# Patient Record
Sex: Female | Born: 1975 | Race: Black or African American | Hispanic: No | Marital: Single | Smoking: Current every day smoker
Health system: Southern US, Community
[De-identification: ages and names within clinical notes are randomized; demographics above are authoritative.]

## PROBLEM LIST (undated history)

## (undated) HISTORY — PX: TUBAL LIGATION: SHX77

---

## 2003-11-04 ENCOUNTER — Emergency Department: Payer: Self-pay | Admitting: Emergency Medicine

## 2003-11-13 ENCOUNTER — Ambulatory Visit: Payer: Self-pay | Admitting: Orthopaedic Surgery

## 2019-04-26 ENCOUNTER — Emergency Department: Payer: Self-pay

## 2019-04-26 ENCOUNTER — Inpatient Hospital Stay
Admission: EM | Admit: 2019-04-26 | Discharge: 2019-04-29 | DRG: 392 | Disposition: A | Discharge status: Home / Self Care | Payer: Self-pay | Attending: Surgery | Admitting: Surgery

## 2019-04-26 ENCOUNTER — Other Ambulatory Visit: Payer: Self-pay

## 2019-04-26 DIAGNOSIS — K5720 Diverticulitis of large intestine with perforation and abscess without bleeding: Principal | ICD-10-CM | POA: Diagnosis present

## 2019-04-26 DIAGNOSIS — K5900 Constipation, unspecified: Secondary | ICD-10-CM | POA: Diagnosis present

## 2019-04-26 DIAGNOSIS — K5780 Diverticulitis of intestine, part unspecified, with perforation and abscess without bleeding: Secondary | ICD-10-CM | POA: Diagnosis present

## 2019-04-26 DIAGNOSIS — E876 Hypokalemia: Secondary | ICD-10-CM | POA: Diagnosis present

## 2019-04-26 DIAGNOSIS — K651 Peritoneal abscess: Secondary | ICD-10-CM

## 2019-04-26 DIAGNOSIS — Z20822 Contact with and (suspected) exposure to covid-19: Secondary | ICD-10-CM | POA: Diagnosis present

## 2019-04-26 LAB — COMPREHENSIVE METABOLIC PANEL
ALT: 21 U/L (ref 0–44)
AST: 24 U/L (ref 15–41)
Albumin: 3.4 g/dL — ABNORMAL LOW (ref 3.5–5.0)
Alkaline Phosphatase: 66 U/L (ref 38–126)
Anion gap: 14 (ref 5–15)
BUN: <5 mg/dL — ABNORMAL LOW (ref 6–20)
CO2: 20 mmol/L — ABNORMAL LOW (ref 22–32)
Calcium: 8.9 mg/dL (ref 8.9–10.3)
Chloride: 97 mmol/L — ABNORMAL LOW (ref 98–111)
Creatinine, Ser: 0.93 mg/dL (ref 0.44–1.00)
GFR calc Af Amer: >60 mL/min (ref >60)
GFR calc non Af Amer: >60 mL/min (ref >60)
Glucose, Bld: 188 mg/dL — ABNORMAL HIGH (ref 70–99)
Potassium: 3.1 mmol/L — ABNORMAL LOW (ref 3.5–5.1)
Sodium: 131 mmol/L — ABNORMAL LOW (ref 135–145)
Total Bilirubin: 1.8 mg/dL — ABNORMAL HIGH (ref 0.3–1.2)
Total Protein: 8 g/dL (ref 6.5–8.1)

## 2019-04-26 LAB — CBC
HCT: 36.2 % (ref 36.0–46.0)
Hemoglobin: 11.7 g/dL — ABNORMAL LOW (ref 12.0–15.0)
MCH: 27.6 pg (ref 26.0–34.0)
MCHC: 32.3 g/dL (ref 30.0–36.0)
MCV: 85.4 fL (ref 80.0–100.0)
Platelets: 477 10*3/uL — ABNORMAL HIGH (ref 150–400)
RBC: 4.24 MIL/uL (ref 3.87–5.11)
RDW: 15.9 % — ABNORMAL HIGH (ref 11.5–15.5)
WBC: 15.9 10*3/uL — ABNORMAL HIGH (ref 4.0–10.5)
nRBC: 0 % (ref 0.0–0.2)

## 2019-04-26 LAB — RESPIRATORY PANEL BY RT PCR (FLU A&B, COVID)
Influenza A by PCR: NEGATIVE
Influenza B by PCR: NEGATIVE
SARS Coronavirus 2 by RT PCR: NEGATIVE

## 2019-04-26 LAB — LIPASE, BLOOD: Lipase: 18 U/L (ref 11–51)

## 2019-04-26 MED ORDER — HYDROMORPHONE HCL 1 MG/ML IJ SOLN
1.0000 mg | Freq: Once | INTRAMUSCULAR | Status: AC
  Administered 2019-04-26: 1 mg via INTRAVENOUS
  Filled 2019-04-26: qty 1

## 2019-04-26 MED ORDER — KETOROLAC TROMETHAMINE 30 MG/ML IJ SOLN
30.0000 mg | Freq: Once | INTRAMUSCULAR | Status: AC
  Administered 2019-04-26: 30 mg via INTRAVENOUS
  Filled 2019-04-26: qty 1

## 2019-04-26 MED ORDER — POTASSIUM CHLORIDE IN NACL 20-0.9 MEQ/L-% IV SOLN
INTRAVENOUS | Status: DC
  Filled 2019-04-26 (×8): qty 1000

## 2019-04-26 MED ORDER — METRONIDAZOLE IN NACL 5-0.79 MG/ML-% IV SOLN
500.0000 mg | Freq: Once | INTRAVENOUS | Status: AC
  Administered 2019-04-26: 500 mg via INTRAVENOUS
  Filled 2019-04-26: qty 100

## 2019-04-26 MED ORDER — ONDANSETRON HCL 4 MG/2ML IJ SOLN
4.0000 mg | Freq: Once | INTRAMUSCULAR | Status: AC
  Administered 2019-04-26: 4 mg via INTRAVENOUS
  Filled 2019-04-26: qty 2

## 2019-04-26 MED ORDER — KETOROLAC TROMETHAMINE 30 MG/ML IJ SOLN
30.0000 mg | Freq: Four times a day (QID) | INTRAMUSCULAR | Status: DC | PRN
  Administered 2019-04-27 – 2019-04-28 (×2): 30 mg via INTRAVENOUS
  Filled 2019-04-26 (×2): qty 1

## 2019-04-26 MED ORDER — ONDANSETRON 4 MG PO TBDP: 4.0000 mg | ORAL_TABLET | Freq: Four times a day (QID) | ORAL | Status: DC | PRN

## 2019-04-26 MED ORDER — HYDRALAZINE HCL 20 MG/ML IJ SOLN: 10.0000 mg | INTRAMUSCULAR | Status: DC | PRN

## 2019-04-26 MED ORDER — IOHEXOL 300 MG/ML  SOLN
100.0000 mL | Freq: Once | INTRAMUSCULAR | Status: AC | PRN
  Administered 2019-04-26: 100 mL via INTRAVENOUS

## 2019-04-26 MED ORDER — SODIUM CHLORIDE 0.9 % IV BOLUS
1000.0000 mL | Freq: Once | INTRAVENOUS | Status: AC
  Administered 2019-04-26: 1000 mL via INTRAVENOUS

## 2019-04-26 MED ORDER — MORPHINE SULFATE (PF) 4 MG/ML IV SOLN
4.0000 mg | Freq: Once | INTRAVENOUS | Status: AC
  Administered 2019-04-26: 4 mg via INTRAVENOUS
  Filled 2019-04-26: qty 1

## 2019-04-26 MED ORDER — HYDROMORPHONE HCL 1 MG/ML IJ SOLN
0.5000 mg | INTRAMUSCULAR | Status: DC | PRN
  Administered 2019-04-26 – 2019-04-29 (×10): 1 mg via INTRAVENOUS
  Filled 2019-04-26 (×11): qty 1

## 2019-04-26 MED ORDER — ONDANSETRON HCL 4 MG/2ML IJ SOLN
4.0000 mg | Freq: Four times a day (QID) | INTRAMUSCULAR | Status: DC | PRN
  Administered 2019-04-27 – 2019-04-28 (×4): 4 mg via INTRAVENOUS
  Filled 2019-04-26 (×4): qty 2

## 2019-04-26 MED ORDER — OXYCODONE HCL 5 MG PO TABS
5.0000 mg | ORAL_TABLET | ORAL | Status: DC | PRN
  Administered 2019-04-26: 5 mg via ORAL
  Administered 2019-04-27 – 2019-04-29 (×6): 10 mg via ORAL
  Filled 2019-04-26 (×3): qty 2
  Filled 2019-04-26: qty 1
  Filled 2019-04-26 (×3): qty 2

## 2019-04-26 MED ORDER — SODIUM CHLORIDE 0.9 % IV SOLN
INTRAVENOUS | Status: DC | PRN
  Administered 2019-04-26: 250 mL via INTRAVENOUS
  Administered 2019-04-27: 1000 mL via INTRAVENOUS
  Administered 2019-04-27 – 2019-04-28 (×2): 250 mL via INTRAVENOUS

## 2019-04-26 MED ORDER — ACETAMINOPHEN 500 MG PO TABS
1000.0000 mg | ORAL_TABLET | Freq: Four times a day (QID) | ORAL | Status: DC
  Administered 2019-04-26 – 2019-04-29 (×10): 1000 mg via ORAL
  Filled 2019-04-26 (×11): qty 2

## 2019-04-26 MED ORDER — CIPROFLOXACIN IN D5W 400 MG/200ML IV SOLN
400.0000 mg | Freq: Once | INTRAVENOUS | Status: AC
  Administered 2019-04-26: 400 mg via INTRAVENOUS
  Filled 2019-04-26: qty 200

## 2019-04-26 MED ORDER — IOHEXOL 9 MG/ML PO SOLN
500.0000 mL | ORAL | Status: AC
  Administered 2019-04-26 (×2): 500 mL via ORAL

## 2019-04-26 MED ORDER — PIPERACILLIN-TAZOBACTAM 3.375 G IVPB
3.3750 g | Freq: Three times a day (TID) | INTRAVENOUS | Status: DC
  Administered 2019-04-26 – 2019-04-29 (×9): 3.375 g via INTRAVENOUS
  Filled 2019-04-26 (×9): qty 50

## 2019-04-26 NOTE — ED Notes (Signed)
Unable to have a BM or urinate at this time. Back in bed resting.

## 2019-04-26 NOTE — ED Notes (Signed)
Pt ambulatory to bathroom independently w/out difficulty.

## 2019-04-26 NOTE — ED Notes (Signed)
This RN and EDP, Paduchowski to bedside; rectal exam performed via EDP.

## 2019-04-26 NOTE — Consult Note (Signed)
Chief Complaint: Patient was seen in consultation today for intra-abdominal/pelvic abscess  Referring Physician(s): Lynden Oxford, PA-C/Pabon, Diego, MD  Supervising Physician: Richarda Overlie  Patient Status: [facility] - ED  History of Present Illness: Haley Delgado is a 44 y.o. female with a past medical history significant for perforated diverticulitis with abscess and drain placement at [facility] (admitted 04/04/19 - 04/08/19) with removal 04/20/19 who presented to [facility] ED earlier this morning with complaints of worsening abdominal pain x 2 days. Initial workup in the ED notable for tachycardia (100-123), WBC 15.9, hgb 11.7, plt 477, K+ 3.1, t.bili 1.8, COVID (-). CT abd/pelvis w/copntrast was performed which showed a 2.8 x 3.9 x 2.2 cm fluid collection along the dome of the uterus likely representing recurrent abscess, larger collection in the cul-de-sac possibly communicating with the more superior collection, inflammatory changes around the adnexa and distal small bowel and extensive diverticular changes within the mid sigmoid colon. General surgery was consulted and as no emergent surgical intervention was indicated IR has been consulted for percutaneous drain placement.   Haley Delgado states she felt very good initially after her previous drain was removed, however she gradually began to have worsening abdominal pain that started off as a dull/achy feeling and over the last two days has progressed to intense pressure/tenderness in her lower abdomen. She also reports rectal pain and the sensation of needing to have a bowel movement but is unable to do so. She states her PO intake at home has been poor with intermittent nausea and vomiting, her last bowel movement was on Thursday. She denies any melena, hematochezia or hematemesis. She understands the need for another drain and while she is disappointed, she is agreeable to proceed.  History reviewed. No pertinent past medical  history.  History reviewed. No pertinent surgical history.  Allergies: Peanut oil  Medications: Prior to Admission medications   Not on File     No family history on file.  Social History   Socioeconomic History  . Marital status: Single    Spouse name: Not on file  . Number of children: Not on file  . Years of education: Not on file  . Highest education level: Not on file  Occupational History  . Not on file  Tobacco Use  . Smoking status: Not on file  Substance and Sexual Activity  . Alcohol use: Not on file  . Drug use: Not on file  . Sexual activity: Not on file  Other Topics Concern  . Not on file  Social History Narrative  . Not on file   Social Determinants of Health   Financial Resource Strain:   . Difficulty of Paying Living Expenses:   Food Insecurity:   . Worried About Programme researcher, broadcasting/film/video in the Last Year:   . Barista in the Last Year:   Transportation Needs:   . Freight forwarder (Medical):   Marland Kitchen Lack of Transportation (Non-Medical):   Physical Activity:   . Days of Exercise per Week:   . Minutes of Exercise per Session:   Stress:   . Feeling of Stress :   Social Connections:   . Frequency of Communication with Friends and Family:   . Frequency of Social Gatherings with Friends and Family:   . Attends Religious Services:   . Active Member of Clubs or Organizations:   . Attends Banker Meetings:   Marland Kitchen Marital Status:      Review of Systems: A 12  point ROS discussed and pertinent positives are indicated in the HPI above.  All other systems are negative.  Review of Systems  Constitutional: Positive for appetite change and fatigue. Negative for chills and fever.  Respiratory: Negative for cough and shortness of breath.   Cardiovascular: Negative for chest pain.  Gastrointestinal: Positive for abdominal pain, nausea, rectal pain and vomiting. Negative for blood in stool, constipation and diarrhea.  Genitourinary: Negative  for dysuria and hematuria.  Musculoskeletal: Negative for back pain.  Skin: Negative for color change.  Neurological: Negative for dizziness and headaches.    Vital Signs: BP 125/69   Pulse (!) 107   Temp 99.4 F (37.4 C) (Oral)   Resp 16   Ht 5\' 9"  (1.753 m)   Wt 229 lb (103.9 kg)   LMP 04/12/2019 (Exact Date)   SpO2 95%   BMI 33.82 kg/m   Physical Exam Vitals and nursing note reviewed.  Constitutional:      General: She is not in acute distress.    Appearance: She is obese.     Comments: Very pleasant, talkative, good historian.  HENT:     Head: Normocephalic.     Mouth/Throat:     Mouth: Mucous membranes are moist.     Pharynx: Oropharynx is clear. No oropharyngeal exudate or posterior oropharyngeal erythema.  Eyes:     General: No scleral icterus. Cardiovascular:     Rate and Rhythm: Regular rhythm. Tachycardia present.  Pulmonary:     Effort: Pulmonary effort is normal.     Breath sounds: Normal breath sounds.  Abdominal:     General: There is no distension.     Palpations: Abdomen is soft.     Tenderness: There is abdominal tenderness (mostly pelvic and RLQ - some LLQ ).  Skin:    General: Skin is warm and dry.     Coloration: Skin is not jaundiced.  Neurological:     Mental Status: She is alert and oriented to person, place, and time.  Psychiatric:        Mood and Affect: Mood normal.        Behavior: Behavior normal.        Thought Content: Thought content normal.        Judgment: Judgment normal.      MD Evaluation Airway: WNL Heart: WNL Abdomen: WNL Chest/ Lungs: WNL ASA  Classification: 2 Mallampati/Airway Score: Two   Imaging: CT ABDOMEN PELVIS W CONTRAST  Result Date: 04/26/2019 CLINICAL DATA:  Abdominal pain. Recent episode of diverticulitis complicated without abscess. Percutaneous drain removed 1 week ago. EXAM: CT ABDOMEN AND PELVIS WITH CONTRAST TECHNIQUE: Multidetector CT imaging of the abdomen and pelvis was performed using the  standard protocol following bolus administration of intravenous contrast. CONTRAST:  19mL OMNIPAQUE IOHEXOL 300 MG/ML  SOLN COMPARISON:  Report of CT abdomen and pelvis at [facility] 04/18/19 FINDINGS: Lower chest: Mild dependent airspace disease is present in the left lower lobe. Right lung is clear. Heart size is normal. Hepatobiliary: No focal liver abnormality is seen. No gallstones, gallbladder wall thickening, or biliary dilatation. Pancreas: Unremarkable. No pancreatic ductal dilatation or surrounding inflammatory changes. Spleen: Normal in size without focal abnormality. Adrenals/Urinary Tract: 7 mm myelolipoma again seen in the right adrenal gland. Left adrenal is normal. The kidneys and ureters are within normal limits bilaterally. The urinary bladder is normal. Stomach/Bowel: A small hiatal hernia is present. The stomach and duodenum are otherwise within normal limits. The small bowel is unremarkable. Bowel is mostly  displaced by marked inflammatory changes within the anatomic pelvis. The inflammatory changes surround the distal small bowel. Cecum is within normal limits. The ascending and transverse colon are normal. The descending colon is within normal limits. Inflammatory changes are mostly anterior to the mid sigmoid colon with extensive diverticular change. Distal sigmoid is collapsed. Vascular/Lymphatic: Atherosclerotic calcifications are present without aneurysm. Reproductive: Uterus is within normal limits. Inflammatory changes surround the adnexa bilaterally. Other: A fluid collection along the dome of the uterus measures 2.8 x 3.9 x 2.2 cm. This may communicate with a much larger collection posteriorly in the cul-de-sac measuring 8.3 x 5.8 x 7.5 cm. This displaces the uterus and the rectosigmoid colon. Portions of this collection extend superiorly on both sides. Musculoskeletal: Sclerotic changes in chronic disc disease are noted at L4-5. Straightening of the normal lumbar lordosis is  evident. Mild degenerative changes are noted in the SI joints. No other focal lytic or blastic lesions are present. Hips are located and within normal limits bilaterally. IMPRESSION: 1. 2.8 x 3.9 x 2.2 cm fluid collection along the dome of the uterus likely represents a recurrent abscess. This appears to be where the drain was previously located. 2. Much larger collection is present in the cul-de-sac, potentially communicating with the more superior collection. This also represents a large abscess. 3. Inflammatory changes surround the adnexa and distal small bowel. 4. Extensive diverticular change within the mid sigmoid colon. The inflammatory changes are adjacent to this portion of the bowel. 5. Mild dependent airspace disease in the left lower lobe likely represents atelectasis. 6. 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 7. Degenerative changes of the lower lumbar spine. These results were called by telephone at the time of interpretation on 04/26/2019 at 9:54 am to provider [facility] , who verbally acknowledged these results. Electronically Signed   By: Marin Roberts M.D.   On: 04/26/2019 09:55    Labs:  CBC: Recent Labs    04/26/19 0732  WBC 15.9*  HGB 11.7*  HCT 36.2  PLT 477*    COAGS: No results for input(s): INR, APTT in the last 8760 hours.  BMP: Recent Labs    04/26/19 0732  NA 131*  K 3.1*  CL 97*  CO2 20*  GLUCOSE 188*  BUN <5*  CALCIUM 8.9  CREATININE 0.93  GFRNONAA >60  GFRAA >60    LIVER FUNCTION TESTS: Recent Labs    04/26/19 0732  BILITOT 1.8*  AST 24  ALT 21  ALKPHOS 66  PROT 8.0  ALBUMIN 3.4*    TUMOR MARKERS: No results for input(s): AFPTM, CEA, CA199, CHROMGRNA in the last 8760 hours.  Assessment and Plan:  44 y/o F with recent perforated diverticulitis requiring percutaneous drain placement 04/04/19 at [facility] with removal of drain 04/20/19 who presented to [facility] ED today with worsening abdominal pain, rectal pain, n/v and  poor PO intake. Initial workup significant for tachycardia and leukocytosis. Imaging shows recurrent diverticular abscess. General surgery was consulted and given no indication for immediate surgical intervention IR has been asked to place a percutaneous abscess drain.  Will plan for drain placement 04/27/19 with Dr. Lowella Dandy - patient to be NPO after midnight, hold lovenox/heparin until post procedure, pre-procedure labs have been ordered by general surgery (appreciate their assistance). IR will call for patient when ready.   While inpatient drain will need to be flushed Qshift with 5 cc NS, output recorded Qshift, call IR if unable to flush drain or sudden change in output,  dressing changes QD or PRN if soiled. Upon discharge patient will need to flush the drain QD with 5 cc NS (will require rx for flushes from admitting service at d/c), record output QD, dressing changes every 2-3 days and follow up in IR clinic 10-14 days post drain placement. I have placed orders to facilitate clinic follow up and written drain care instructions. Please call with any questions or concerns.  Risks and benefits discussed with the patient including bleeding, infection, damage to adjacent structures, bowel perforation/fistula connection, and sepsis.  All of the patient's questions were answered, patient is agreeable to proceed.  Consent signed and in chart.  Thank you for this interesting consult.  I greatly enjoyed meeting Haley Delgado and look forward to participating in their care.  A copy of this report was sent to the requesting provider on this date.  Electronically Signed: Villa Herb, PA-C 04/26/2019, 2:37 PM   I spent a total of 40 Minutes  in face to face in clinical consultation, greater than 50% of which was counseling/coordinating care for diverticular abscess drain placement.

## 2019-04-26 NOTE — ED Triage Notes (Signed)
Pt very poor historian. States that some drain was removed on Tuesday and since then she has been having pain to abdomen and rectum.

## 2019-04-26 NOTE — ED Notes (Signed)
Pt on toilet, attempting to have BM or urinate. Aware that we need sample and has collection hat in toilet.

## 2019-04-26 NOTE — H&P (Signed)
[facility] SURGICAL HISTORY & PHYSICAL (cpt [phone])  HISTORY OF PRESENT ILLNESS (HPI):  Upon chart review, patient was admitted to [facility] from 03/08 - 03/12 for perforated diverticulitis with abscess which was percutaneously drained. She was managed with IV Zosyn and her pain and leukocytosis resolved. She was discharged on Augmentin with general surgery follow up. She had repeat CT in follow up which showed resolution of abscess. As such, drain was removed on 03/24. She was scheduled to follow up with general surgery there following colonoscopy for laparoscopic sigmoid colectomy.   44 y.o. female presented to [facility] ED today for abdominal pain. She reports that after drain removal she was doing well. However, on Sunday evening she noticed the onset of mild lower abdominal pain. This was just an ache at first however this gradually worsened over the course of the next 48 hours. The pain became constant, sharp, and severe across her entire lower abdomen. She notes associated chills, nausea, and non-bloody emesis with the pain. No fever, SOB, CP, or urinary changes aside from urinating less. She does feel more constipated. This presentation feel similar to her one in [place]. No previous abdominal surgeries aside from Tubal Ligation. Work up in the ED today was concerning for leukocytosis to 15.9K, hyponatremia to 131, hypokalemia to 3.1, and CT Abdomen/Pelvis was concerning for again diverticulitis with large pelvic abscess.   General surgery was consulted by emergency medicine physician Dr Minna Antis, MD for evaluation and management of diverticulitis with abscess.   Please note she was offered transfer back to [facility] for continuity of care as this is where she was managed earlier this month however patient and family preferred to stay here.    PAST MEDICAL HISTORY (PMH):  History reviewed. No pertinent past medical history.  Reviewed. Otherwise negative.    PAST SURGICAL HISTORY (PSH):  Tubal ligation Reviewed. Otherwise negative.   MEDICATIONS:  Prior to Admission medications   Not on File     ALLERGIES:  Allergies  Allergen Reactions  . Peanut Oil Anaphylaxis and Other (See Comments)     SOCIAL HISTORY:  Social History   Socioeconomic History  . Marital status: Single    Spouse name: Not on file  . Number of children: Not on file  . Years of education: Not on file  . Highest education level: Not on file  Occupational History  . Not on file  Tobacco Use  . Smoking status: Not on file  Substance and Sexual Activity  . Alcohol use: Not on file  . Drug use: Not on file  . Sexual activity: Not on file  Other Topics Concern  . Not on file  Social History Narrative  . Not on file   Social Determinants of Health   Financial Resource Strain:   . Difficulty of Paying Living Expenses:   Food Insecurity:   . Worried About Programme researcher, broadcasting/film/video in the Last Year:   . Barista in the Last Year:   Transportation Needs:   . Freight forwarder (Medical):   Marland Kitchen Lack of Transportation (Non-Medical):   Physical Activity:   . Days of Exercise per Week:   . Minutes of Exercise per Session:   Stress:   . Feeling of Stress :   Social Connections:   . Frequency of Communication with Friends and Family:   . Frequency of Social Gatherings with Friends and Family:   . Attends Religious Services:   . Active Member of Clubs  or Organizations:   . Attends Archivist Meetings:   Marland Kitchen Marital Status:   Intimate Partner Violence:   . Fear of Current or Ex-Partner:   . Emotionally Abused:   Marland Kitchen Physically Abused:   . Sexually Abused:      FAMILY HISTORY:  No family history on file.  Otherwise negative.   REVIEW OF SYSTEMS:  Review of Systems  Constitutional: Positive for chills. Negative for fever.  HENT: Negative for congestion and sore throat.   Respiratory: Negative for cough and shortness of breath.    Cardiovascular: Negative for chest pain and palpitations.  Gastrointestinal: Positive for abdominal pain, constipation, nausea and vomiting. Negative for diarrhea.  Genitourinary: Negative for dysuria and urgency.  All other systems reviewed and are negative.   VITAL SIGNS:  Temp:  [99.4 F (37.4 C)] 99.4 F (37.4 C) (03/30 0732) Pulse Rate:  [98-123] 110 (03/30 1040) Resp:  [20] 20 (03/30 0732) BP: (116-144)/(75-91) 144/87 (03/30 1038) SpO2:  [98 %-100 %] 100 % (03/30 1040) Weight:  [103.9 kg] 103.9 kg (03/30 0732)     Height: 5\' 9"  (175.3 cm) Weight: 103.9 kg BMI (Calculated): 33.8   PHYSICAL EXAM:  Physical Exam Vitals and nursing note reviewed. Exam conducted with a chaperone present.  Constitutional:      General: She is not in acute distress.    Appearance: She is well-developed. She is obese. She is not ill-appearing.  HENT:     Head: Normocephalic.  Eyes:     General: No scleral icterus.    Extraocular Movements: Extraocular movements intact.  Cardiovascular:     Rate and Rhythm: Regular rhythm. Tachycardia present.     Heart sounds: Normal heart sounds. No murmur.  Pulmonary:     Effort: Pulmonary effort is normal. No respiratory distress.     Breath sounds: Normal breath sounds.  Abdominal:     General: There is no distension.     Palpations: Abdomen is soft.     Tenderness: There is abdominal tenderness in the right lower quadrant, suprapubic area and left lower quadrant. There is no guarding or rebound.     Comments: Her abdomen is soft, there is tenderness throughout the lower abdomen although this appears worse in the suprapubic region, no rebound/guarding/peritonitis  Genitourinary:    Comments: Deferred Skin:    General: Skin is warm and dry.     Coloration: Skin is not jaundiced or pale.  Neurological:     General: No focal deficit present.     Mental Status: She is alert and oriented to person, place, and time.  Psychiatric:        Mood and Affect:  Mood normal.        Behavior: Behavior normal.     INTAKE/OUTPUT:  This shift: No intake/output data recorded.  Last 2 shifts: @IOLAST2SHIFTS @  Labs:  CBC Latest Ref Rng & Units 04/26/2019  WBC 4.0 - 10.5 K/uL 15.9(H)  Hemoglobin 12.0 - 15.0 g/dL 11.7(L)  Hematocrit 36.0 - 46.0 % 36.2  Platelets 150 - 400 K/uL 477(H)   CMP Latest Ref Rng & Units 04/26/2019  Glucose 70 - 99 mg/dL 188(H)  BUN 6 - 20 mg/dL <5(L)  Creatinine 0.44 - 1.00 mg/dL 0.93  Sodium 135 - 145 mmol/L 131(L)  Potassium 3.5 - 5.1 mmol/L 3.1(L)  Chloride 98 - 111 mmol/L 97(L)  CO2 22 - 32 mmol/L 20(L)  Calcium 8.9 - 10.3 mg/dL 8.9  Total Protein 6.5 - 8.1 g/dL 8.0  Total Bilirubin  0.3 - 1.2 mg/dL 9.7(Q)  Alkaline Phos 38 - 126 U/L 66  AST 15 - 41 U/L 24  ALT 0 - 44 U/L 21    Imaging studies:   CT Abdomen/Pelvis (04/26/2019) personally reviewed showing sigmoid diverticulitis and large intra abdominal fluid collections concerning for abscess, and radiologist report reviewed below:  IMPRESSION: 1. 2.8 x 3.9 x 2.2 cm fluid collection along the dome of the uterus likely represents a recurrent abscess. This appears to be where the drain was previously located. 2. Much larger collection is present in the cul-de-sac, potentially communicating with the more superior collection. This also represents a large abscess. 3. Inflammatory changes surround the adnexa and distal small bowel. 4. Extensive diverticular change within the mid sigmoid colon. The inflammatory changes are adjacent to this portion of the bowel. 5. Mild dependent airspace disease in the left lower lobe likely represents atelectasis. 6. 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 7. Degenerative changes of the lower lumbar spine.   Assessment/Plan: (ICD-10's: K70.20) 44 y.o. female with abdominal pain and leukocytosis concerning for recurrent complicated diverticulitis with abscess without pneumoperitoneum or peritonitis.    - Admit to  general surgery  - Discussed with Interventional Radiology and reviewed images, felt amenable to percutaneous drainage. She will be added to the schedule for tomorrow  - NPO  - Initiate IV ABx; will switch to Zosyn  - IVF Resuscitation (NS + 20 mEq KCL); monitor hypokalemia  - pain control prn; antiemetics prn  - monitor abdominal examination  - morning labs (CBC, BMP, PT, INR)   - mobilization if tolerates   - No emergent surgical intervention; she and her family understand if she were to clinically deteriorate or fail conservative management then she may require more urgent intervention in the hospital which would result in likely temporizing colostomy.   - DVT prophylaxis; hold for IR procedure tomorrow  All of the above findings and recommendations were discussed with the patient and her family, and all of her and her family's questions were answered to their expressed satisfaction.  -- Lynden Oxford, PA-C [facility] 04/26/2019, 11:03 AM [phone] M-F: 7am - 4pm

## 2019-04-26 NOTE — Progress Notes (Signed)
Noting request and patient on schedule for 04/27/2019 abscess Drain, spoke with care nurse in ER to keep patient NPO after [place] for procedure with questions answered.

## 2019-04-26 NOTE — ED Provider Notes (Signed)
[facility] Emergency Department Provider Note  Time seen: 7:29 AM  I have reviewed the triage vital signs and the nursing notes.   HISTORY  Chief Complaint Abdominal Pain and Constipation   HPI Adalei R Davidow is a 44 y.o. female with a past medical history of recently diagnosed diverticulitis 1 month ago has finished her antibiotics has finished her pain medication but states over the past 1 week abdominal pain has come back.  Patient also states for the past 1 week she has been constipated.  Describes abdominal pain more as a rectal pain, states it feels like she has to have a bowel movement but cannot.  Patient appears quite uncomfortable.  Denies any nausea or vomiting.  Denies any fever.  No past medical history on file.  There are no problems to display for this patient.   Prior to Admission medications   Not on File    Not on File  No family history on file.  Social History Social History   Tobacco Use  . Smoking status: Not on file  Substance Use Topics  . Alcohol use: Not on file  . Drug use: Not on file    Review of Systems Constitutional: Negative for fever. Cardiovascular: Negative for chest pain. Gastrointestinal: Positive for abdominal pain.  Positive for rectal pain.  Positive constipation. Musculoskeletal: Negative for musculoskeletal complaints Skin: Negative for skin complaints  Neurological: Negative for headache All other ROS negative  ____________________________________________   PHYSICAL EXAM:  VITAL SIGNS: ED Triage Vitals  Enc Vitals Group     BP --      Pulse --      Resp --      Temp --      Temp src --      SpO2 --      Weight 04/26/19 0710 229 lb (103.9 kg)     Height 04/26/19 0710 5\' 9"  (1.753 m)     Head Circumference --      Peak Flow --      Pain Score 04/26/19 0709 10     Pain Loc --      Pain Edu? --      Excl. in [place]? --    Constitutional: Alert and oriented. Well appearing and in no  distress. Eyes: Normal exam ENT      Head: Normocephalic and atraumatic.      Mouth/Throat: Mucous membranes are moist. Cardiovascular: Normal rate, regular rhythm. Respiratory: Normal respiratory effort without tachypnea nor retractions. Breath sounds are clear Gastrointestinal: Soft, mild diffuse tenderness without rebound guarding or distention. Musculoskeletal: Nontender with normal range of motion in all extremities.  Neurologic:  Normal speech and language. No gross focal neurologic deficits  Skin:  Skin is warm, dry and intact.  Psychiatric: Mood and affect are normal.   ____________________________________________    RADIOLOGY  Recurrent abscess as well as a larger cul-de-sac abscess.  ____________________________________________   INITIAL IMPRESSION / ASSESSMENT AND PLAN / ED COURSE  Pertinent labs & imaging results that were available during my care of the patient were reviewed by me and considered in my medical decision making (see chart for details).   Patient states recently diagnosed diverticulitis in [facility].  Finished a course of antibiotics, states she had a drain that was placed in her abdomen that was recently removed several days ago.  States she has not been able to have a bowel movement for the past 1 week has now developed abdominal pain/rectal pain.  Differential would include fecal impaction, constipation, recurrent intra-abdominal abscess or microperforation, recurrent or continued diverticulitis.  We will dose pain medication we will perform a rectal exam.  Patient will likely require CT imaging the abdomen/pelvis given her recent history of complicated diverticulitis.  Rectal examination shows no hard stool in the rectal vault.  Soft stool present.  CT pending.  CT consistent with recurrent abscess as well as larger additional abscess in cul-de-sac.  We will discussed with general surgery.  Spoke to the patient she wishes to stay at  this hospital.  I spoke to Dr. Everlene Farrier who will be admitting to his service.  Patient receiving IV antibiotics at this time.  Vernadine R Schorr was evaluated in Emergency Department on 04/26/2019 for the symptoms described in the history of present illness. She was evaluated in the context of the global COVID-19 pandemic, which necessitated consideration that the patient might be at risk for infection with the SARS-CoV-2 virus that causes COVID-19. Institutional protocols and algorithms that pertain to the evaluation of patients at risk for COVID-19 are in a state of rapid change based on information released by regulatory bodies including the CDC and federal and state organizations. These policies and algorithms were followed during the patient's care in the ED.  ____________________________________________   FINAL CLINICAL IMPRESSION(S) / ED DIAGNOSES  Abdominal pain Diverticulitis with recurrent abscess   Minna Antis, MD 04/26/19 1048

## 2019-04-27 ENCOUNTER — Encounter: Payer: Self-pay | Admitting: Surgery

## 2019-04-27 ENCOUNTER — Inpatient Hospital Stay: Payer: Self-pay

## 2019-04-27 LAB — PROTIME-INR
INR: 1.4 — ABNORMAL HIGH (ref 0.8–1.2)
Prothrombin Time: 17.2 seconds — ABNORMAL HIGH (ref 11.4–15.2)

## 2019-04-27 LAB — CBC
HCT: 33.8 % — ABNORMAL LOW (ref 36.0–46.0)
Hemoglobin: 10.7 g/dL — ABNORMAL LOW (ref 12.0–15.0)
MCH: 27.4 pg (ref 26.0–34.0)
MCHC: 31.7 g/dL (ref 30.0–36.0)
MCV: 86.4 fL (ref 80.0–100.0)
Platelets: 340 10*3/uL (ref 150–400)
RBC: 3.91 MIL/uL (ref 3.87–5.11)
RDW: 15.9 % — ABNORMAL HIGH (ref 11.5–15.5)
WBC: 12.8 10*3/uL — ABNORMAL HIGH (ref 4.0–10.5)
nRBC: 0 % (ref 0.0–0.2)

## 2019-04-27 LAB — BASIC METABOLIC PANEL
Anion gap: 11 (ref 5–15)
BUN: 9 mg/dL (ref 6–20)
CO2: 22 mmol/L (ref 22–32)
Calcium: 8.1 mg/dL — ABNORMAL LOW (ref 8.9–10.3)
Chloride: 103 mmol/L (ref 98–111)
Creatinine, Ser: 0.86 mg/dL (ref 0.44–1.00)
GFR calc Af Amer: >60 mL/min (ref >60)
GFR calc non Af Amer: >60 mL/min (ref >60)
Glucose, Bld: 131 mg/dL — ABNORMAL HIGH (ref 70–99)
Potassium: 3.1 mmol/L — ABNORMAL LOW (ref 3.5–5.1)
Sodium: 136 mmol/L (ref 135–145)

## 2019-04-27 LAB — APTT: aPTT: 42 seconds — ABNORMAL HIGH (ref 24–36)

## 2019-04-27 MED ORDER — FENTANYL CITRATE (PF) 100 MCG/2ML IJ SOLN
INTRAMUSCULAR | Status: AC
  Filled 2019-04-27: qty 2

## 2019-04-27 MED ORDER — MIDAZOLAM HCL 5 MG/5ML IJ SOLN
INTRAMUSCULAR | Status: AC | PRN
  Administered 2019-04-27 (×4): 1 mg via INTRAVENOUS

## 2019-04-27 MED ORDER — FENTANYL CITRATE (PF) 100 MCG/2ML IJ SOLN
INTRAMUSCULAR | Status: AC | PRN
  Administered 2019-04-27 (×2): 25 ug via INTRAVENOUS
  Administered 2019-04-27: 50 ug via INTRAVENOUS
  Administered 2019-04-27: 25 ug via INTRAVENOUS

## 2019-04-27 MED ORDER — MIDAZOLAM HCL 5 MG/5ML IJ SOLN
INTRAMUSCULAR | Status: AC
  Filled 2019-04-27: qty 5

## 2019-04-27 NOTE — Progress Notes (Signed)
   04/27/19 1940  Clinical Encounter Type  Visited With Patient  Visit Type Follow-up  Referral From Chaplain  Consult/Referral To Chaplain  When Chaplain arrive, patient was lying in bed with the lights off and the television on. Chaplain inquired about how she was feeling and she replied ok. Chaplain repeated what she said and looked at her. After a few minutes of silents, patient begin talking about her deceased daughter. Patient explained that she hasn't received autopsy results, but she believe it was a drug overdose. After saying that she started crying and blaming herself. Chaplain questioned the reason for the blame. Patient said I should have done something. I was working, but should have gotten there sooner. I should have turned her over. Chaplain tried to explain that her daughter's death was not her fault and that she did not do anything to cause her death. Chaplain also explain that people make choices and decisions and the decision made was not hers. Patient also talked about her 74 month old granddaughter and she showed Chaplain pictures of her granddaughter. Patient talked about not being able to work because she is sick. She also explained that her and her other daughter moved to [place] to to be near her mother. Chaplain encouraged self-care, explaining in order to take care of others, patient must take care of herself. Chaplain asked if she could pray with patient and she said yes. Chaplain noticed tears in patient's eyes and told her how special she was and the importance of taking care of herself. After praying patient thanked Chaplain for taking time to see her and Chaplain thanked her for the opportunity. Chaplain offered pastoral presence, empathy, and prayer.

## 2019-04-27 NOTE — Progress Notes (Signed)
Patient c/o continuous pain and inability to get comfortable and sleep  , prn administered @ 0056. Will continue to monitor to ensure comfort and safety.

## 2019-04-27 NOTE — Plan of Care (Signed)
  Problem: Pain Managment: Goal: General experience of comfort will improve Outcome: Progressing  Patient voices all needs without difficulty. Patient makes sure to notify nurse when need r/t pain management to prevent uncontrollable level of pain.  Will continue to assess and address all needs for duration of shift.

## 2019-04-27 NOTE — Progress Notes (Signed)
[facility] SURGICAL PROGRESS NOTE (cpt [phone])  Hospital Day(s): 1.   Interval History: Patient seen and examined, no acute events or new complaints overnight. Patient reports she had issues with pain overnight but this is better controlled this morning. Mild nausea. No emesis. She did have a fever yesterday afternoon to 101.2 but none since. Still with mild hypokalemia to 3.1, renal function normal, leukocytosis to 12.8K. Plan for CT guided drainage today  Review of Systems:  Constitutional: denies fever, chills  HEENT: denies cough or congestion  Respiratory: denies any shortness of breath  Cardiovascular: denies chest pain or palpitations  Gastrointestinal: + abdominal pain, + Nausea, denied Vomiting, or diarrhea/and bowel function as per interval history Genitourinary: denies burning with urination or urinary frequency   Vital signs in last 24 hours: [min-max] current  Temp:  [97.6 F (36.4 C)-101.2 F (38.4 C)] 97.8 F (36.6 C) (03/31 0433) Pulse Rate:  [82-126] 89 (03/31 0613) Resp:  [16-20] 16 (03/31 0433) BP: (79-144)/(43-91) 122/87 (03/31 0433) SpO2:  [94 %-100 %] 97 % (03/31 0613) Weight:  [103.9 kg] 103.9 kg (03/30 0732)     Height: 5\' 9"  (175.3 cm) Weight: 103.9 kg BMI (Calculated): 33.8   Intake/Output last 2 shifts:  03/30 0701 - 03/31 0700 In: 76 [IV Piggyback:76] Out: 250 [Urine:250]   Physical Exam:  Constitutional: alert, cooperative and no distress  HENT: normocephalic without obvious abnormality  Eyes: PERRL, EOM's grossly intact and symmetric  Respiratory: breathing non-labored at rest  Cardiovascular: regular rate and sinus rhythm  Gastrointestinal: Soft, tenderness across the lower abdomen, and non-distended, no rebound/guarding/peritonitis  Musculoskeletal: UE and LE FROM, no edema or wounds, motor and sensation grossly intact, NT    Labs:  CBC Latest Ref Rng & Units 04/27/2019 04/26/2019  WBC 4.0 - 10.5 K/uL 12.8(H) 15.9(H)   Hemoglobin 12.0 - 15.0 g/dL 10.7(L) 11.7(L)  Hematocrit 36.0 - 46.0 % 33.8(L) 36.2  Platelets 150 - 400 K/uL 340 477(H)   CMP Latest Ref Rng & Units 04/27/2019 04/26/2019  Glucose 70 - 99 mg/dL 131(H) 188(H)  BUN 6 - 20 mg/dL 9 <5(L)  Creatinine 0.44 - 1.00 mg/dL 0.86 0.93  Sodium 135 - 145 mmol/L 136 131(L)  Potassium 3.5 - 5.1 mmol/L 3.1(L) 3.1(L)  Chloride 98 - 111 mmol/L 103 97(L)  CO2 22 - 32 mmol/L 22 20(L)  Calcium 8.9 - 10.3 mg/dL 8.1(L) 8.9  Total Protein 6.5 - 8.1 g/dL - 8.0  Total Bilirubin 0.3 - 1.2 mg/dL - 1.8(H)  Alkaline Phos 38 - 126 U/L - 66  AST 15 - 41 U/L - 24  ALT 0 - 44 U/L - 21     Imaging studies: No new pertinent imaging studies   Assessment/Plan: (ICD-10's: K39.20) 44 y.o. female with abdominal pain and leukocytosis concerning for recurrent complicated diverticulitis with abscess without pneumoperitoneum or peritonitis.    - NPO for procedure   - Will plan on percutaneous drainage with IR today; appreciate their help   - Continue IV ABx (Zosyn)   - IVF Resuscitation (NS + 20 mEq KCL); monitor hypokalemia             - pain control prn; antiemetics prn             - monitor abdominal examination             - morning labs (CBC, BMP)              - mobilization if tolerates              -  No emergent surgical intervention; she and her family understand if she were to clinically deteriorate or fail conservative management then she may require more urgent intervention in the hospital which would result in likely temporizing colostomy.              - DVT prophylaxis; hold for IR procedure today  All of the above findings and recommendations were discussed with the patient, and the medical team, and all of patient's questions were answered to her expressed satisfaction.   -- Lynden Oxford, PA-C  [facility] 04/27/2019, 7:27 AM [phone] M-F: 7am - 4pm

## 2019-04-27 NOTE — Procedures (Signed)
Interventional Radiology Procedure:   Indications: Diverticular abscesses  Procedure: 1) Placement of right transgluteal drain  2) Placement of anterior abdominal drain  Findings: 1) 220 ml of yellow purulent fluid removed from pelvic drain 2) 20 ml of purulent fluid removed from anterior drain.  Complications: None     EBL: less than 10 ml  Plan: Will need follow up CT and drain injection as outpatient prior to drain removals.    Suliman Termini R. Lowella Dandy, MD  Pager: [phone]

## 2019-04-27 NOTE — Progress Notes (Signed)
   04/27/19 1330  Clinical Encounter Type  Visited With Patient  Visit Type Initial  Referral From Nurse  Consult/Referral To Chaplain  Chaplain stopped to see patient at the request of Nurse Sherea. When Chaplain arrived, patient was in the bathroom and transportation was waiting to take to her an appointment. Chaplain told patient that she will be back. The referral was made because patient's daughter died a month ago leaving a one-month old baby. Calton Dach thought patient would benefit from a Chaplain's visit.

## 2019-04-28 LAB — BASIC METABOLIC PANEL
Anion gap: 6 (ref 5–15)
BUN: 8 mg/dL (ref 6–20)
CO2: 25 mmol/L (ref 22–32)
Calcium: 7.8 mg/dL — ABNORMAL LOW (ref 8.9–10.3)
Chloride: 104 mmol/L (ref 98–111)
Creatinine, Ser: 0.62 mg/dL (ref 0.44–1.00)
GFR calc Af Amer: >60 mL/min (ref >60)
GFR calc non Af Amer: >60 mL/min (ref >60)
Glucose, Bld: 94 mg/dL (ref 70–99)
Potassium: 3.2 mmol/L — ABNORMAL LOW (ref 3.5–5.1)
Sodium: 135 mmol/L (ref 135–145)

## 2019-04-28 LAB — URINALYSIS, COMPLETE (UACMP) WITH MICROSCOPIC
Bacteria, UA: NONE SEEN
Bilirubin Urine: NEGATIVE
Glucose, UA: NEGATIVE mg/dL
Hgb urine dipstick: NEGATIVE
Ketones, ur: NEGATIVE mg/dL
Nitrite: NEGATIVE
Protein, ur: 30 mg/dL — AB
Specific Gravity, Urine: 1.039 — ABNORMAL HIGH (ref 1.005–1.030)
WBC, UA: >50 WBC/hpf — ABNORMAL HIGH (ref 0–5)
pH: 5 (ref 5.0–8.0)

## 2019-04-28 LAB — CBC
HCT: 29.7 % — ABNORMAL LOW (ref 36.0–46.0)
Hemoglobin: 9.4 g/dL — ABNORMAL LOW (ref 12.0–15.0)
MCH: 27.6 pg (ref 26.0–34.0)
MCHC: 31.6 g/dL (ref 30.0–36.0)
MCV: 87.1 fL (ref 80.0–100.0)
Platelets: 305 10*3/uL (ref 150–400)
RBC: 3.41 MIL/uL — ABNORMAL LOW (ref 3.87–5.11)
RDW: 15.9 % — ABNORMAL HIGH (ref 11.5–15.5)
WBC: 10.7 10*3/uL — ABNORMAL HIGH (ref 4.0–10.5)
nRBC: 0 % (ref 0.0–0.2)

## 2019-04-28 MED ORDER — ENOXAPARIN SODIUM 40 MG/0.4ML ~~LOC~~ SOLN
40.0000 mg | SUBCUTANEOUS | Status: DC
  Administered 2019-04-28: 40 mg via SUBCUTANEOUS
  Filled 2019-04-28: qty 0.4

## 2019-04-28 MED ORDER — SODIUM CHLORIDE 0.9% FLUSH
5.0000 mL | Freq: Three times a day (TID) | INTRAVENOUS | Status: DC
  Administered 2019-04-28 – 2019-04-29 (×4): 5 mL

## 2019-04-28 MED ORDER — ENOXAPARIN SODIUM 40 MG/0.4ML ~~LOC~~ SOLN: 40.0000 mg | SUBCUTANEOUS | Status: DC

## 2019-04-28 NOTE — TOC Initial Note (Addendum)
Transition of Care [facility]) - Initial/Assessment Note    Patient Details  Name: Haley Delgado MRN: [id number] Date of Birth: 1975-04-02  [facility] CM/SW Contact:    Candie Chroman, LCSW Phone Number: 04/28/2019, 9:32 AM  Clinical Narrative: CSW met with patient. No supports at bedside. CSW introduced role. Patient confirmed she recently moved to [facility]. She is living with her mother. She does not have a PCP. Provided booklet for free/low-cost healthcare in [place]. Patient says she has Family Planning Medicaid for some reason and that the financial counselor at her previous hospital [facility] was trying to help her get it switched to regular Medicaid. Provided patient with intake paperwork for [facility] as well in case she is able to go there with [facility]. Emailed Development worker, community requesting that she check the status of her Medicaid plan switch. No further concerns. CSW encouraged patient to contact CSW as needed. CSW will continue to follow patient for support and facilitate return home when stable. Unsure if we will be able to get new medications at [facility] or not. May have to use GoodRx coupons.    1:55 pm: Spoke to [place] at [facility]. As long as patient only has [facility], she can get any new medications filled there at discharge.              Expected Discharge Plan: Home/Self Care Barriers to Discharge: Continued Medical Work up   Patient Goals and CMS Choice        Expected Discharge Plan and Services Expected Discharge Plan: Home/Self Care       Living arrangements for the past 2 months: Single Family Home                                      Prior Living Arrangements/Services Living arrangements for the past 2 months: Single Family Home Lives with:: Parents Patient language and need for interpreter reviewed:: Yes Do you feel safe going  back to the place where you live?: Yes      Need for Family Participation in Patient Care: Yes (Comment) Care giver support system in place?: Yes (comment)   Criminal Activity/Legal Involvement Pertinent to Current Situation/Hospitalization: No - Comment as needed  Activities of Daily Living Home Assistive Devices/Equipment: None ADL Screening (condition at time of admission) Patient's cognitive ability adequate to safely complete daily activities?: Yes Is the patient deaf or have difficulty hearing?: No Does the patient have difficulty seeing, even when wearing glasses/contacts?: No Does the patient have difficulty concentrating, remembering, or making decisions?: No Patient able to express need for assistance with ADLs?: Yes Does the patient have difficulty dressing or bathing?: No Independently performs ADLs?: Yes (appropriate for developmental age) Does the patient have difficulty walking or climbing stairs?: No Weakness of Legs: None Weakness of Arms/Hands: None  Permission Sought/Granted                  Emotional Assessment Appearance:: Appears stated age Attitude/Demeanor/Rapport: Engaged, Gracious Affect (typically observed): Accepting, Appropriate, Calm, Pleasant Orientation: : Oriented to Self, Oriented to Place, Oriented to  Time, Oriented to Situation Alcohol / Substance Use: Not Applicable Psych Involvement: No (comment)  Admission diagnosis:  Intra-abdominal abscess (HCC) [K65.1] Diverticulitis of large intestine with abscess without bleeding [K57.20] Diverticulitis of intestine with abscess without bleeding, unspecified part of intestinal tract [K57.80]  Patient Active Problem List   Diagnosis Date Noted  . Diverticulitis of intestine with abscess without bleeding 04/26/2019   PCP:  Patient, No Pcp Per Pharmacy:   [id number] [place] [place] [id number]: [phone]:  [phone]    Social Determinants of Health (SDOH) Interventions    Readmission Risk Interventions No flowsheet data found.

## 2019-04-28 NOTE — Progress Notes (Signed)
[facility] SURGICAL PROGRESS NOTE (cpt [phone])  Hospital Day(s): 2.   Interval History: Patient seen and examined, no acute events or new complaints overnight. Patient reports she still has some abdominal pain in her lower abdomen but this is overall improved compared to presentation. Some nausea but no emesis or fever. Leukocytosis continues to improved, down to 10.7K. still with mild hypokalemia to 3.2 which is being repleted. She did have 2 drains placed with IR yesterday both with purulent fluid out at time of placement. Cultures pending. She was started on CLD and tolerated well.   Review of Systems:  Constitutional: denies fever, chills  HEENT: denies cough or congestion  Respiratory: denies any shortness of breath  Cardiovascular: denies chest pain or palpitations  Gastrointestinal: + abdominal pain (improved), + Nausea, denied Vomitting, or diarrhea/and bowel function as per interval history Genitourinary: denies burning with urination or urinary frequency   Vital signs in last 24 hours: [min-max] current  Temp:  [97.9 F (36.6 C)-100.3 F (37.9 C)] 97.9 F (36.6 C) (04/01 0422) Pulse Rate:  [68-129] 68 (04/01 0435) Resp:  [13-24] 18 (04/01 0422) BP: (86-132)/(48-85) 90/65 (04/01 0435) SpO2:  [96 %-100 %] 98 % (04/01 0422)     Height: 5\' 9"  (175.3 cm) Weight: 103.9 kg BMI (Calculated): 33.8   Intake/Output last 2 shifts:  03/31 0701 - 04/01 0700 In: 3789.9 [P.O.:240; I.V.:3383.5; IV Piggyback:161.4] Out: 356 [Urine:100; Drains:256]   Physical Exam:  Constitutional: alert, cooperative and no distress  HENT: normocephalic without obvious abnormality  Eyes: PERRL, EOM's grossly intact and symmetric  Respiratory: breathing non-labored at rest  Cardiovascular: regular rate and sinus rhythm  Gastrointestinal: soft, soreness in the lower abdomen but this is improved compared to previous exams, and non-distended, no rebound/guarding. Anterior drain with  serosanguinous output, right gluteal drain with seropurulent output Musculoskeletal: no edema or wounds, motor and sensation grossly intact, NT    Labs:  CBC Latest Ref Rng & Units 04/28/2019 04/27/2019 04/26/2019  WBC 4.0 - 10.5 K/uL 10.7(H) 12.8(H) 15.9(H)  Hemoglobin 12.0 - 15.0 g/dL 04/28/2019) 10.7(L) 11.7(L)  Hematocrit 36.0 - 46.0 % 29.7(L) 33.8(L) 36.2  Platelets 150 - 400 K/uL 305 340 477(H)   CMP Latest Ref Rng & Units 04/28/2019 04/27/2019 04/26/2019  Glucose 70 - 99 mg/dL 94 04/28/2019) 676(P)  BUN 6 - 20 mg/dL 8 9 950(D)  Creatinine <3(O - 1.00 mg/dL 6.71 2.45 8.09  Sodium 135 - 145 mmol/L 135 136 131(L)  Potassium 3.5 - 5.1 mmol/L 3.2(L) 3.1(L) 3.1(L)  Chloride 98 - 111 mmol/L 104 103 97(L)  CO2 22 - 32 mmol/L 25 22 20(L)  Calcium 8.9 - 10.3 mg/dL 7.8(L) 8.1(L) 8.9  Total Protein 6.5 - 8.1 g/dL - - 8.0  Total Bilirubin 0.3 - 1.2 mg/dL - - 1.8(H)  Alkaline Phos 38 - 126 U/L - - 66  AST 15 - 41 U/L - - 24  ALT 0 - 44 U/L - - 21     Imaging studies: No new pertinent imaging studies   Assessment/Plan: (ICD-10's: K34.20) 44 y.o. female with recurrent complicated diverticulitis with abscess without pneumoperitoneum or peritonitis s/p percutaneous drain placement x2   - Okay to advance to full liquid diet  - Continue IV ABx (Zosyn); follow up Cx              - IVF Resuscitation (NS + 20 mEq KCL)  - Monitor hypokalemia; can give PO KDUR today as well if needed  - pain control prn; antiemetics  prn - monitor abdominal examination - morning labs (CBC, BMP)  - mobilization if tolerates  - No emergent surgical intervention; she and her family understand if she were to clinically deteriorate or fail conservative management then she may require more urgent intervention in the hospital which would result in likely temporizing colostomy.    - Okay to resume DVT prophylaxis today   All of the above findings and recommendations were discussed with  the patient, and the medical team, and all of patient's questions were answered to her expressed satisfaction.  -- Edison Simon, PA-C [facility] 04/28/2019, 8:00 AM [phone] M-F: 7am - 4pm

## 2019-04-29 LAB — BASIC METABOLIC PANEL
Anion gap: 8 (ref 5–15)
BUN: 6 mg/dL (ref 6–20)
CO2: 26 mmol/L (ref 22–32)
Calcium: 7.7 mg/dL — ABNORMAL LOW (ref 8.9–10.3)
Chloride: 103 mmol/L (ref 98–111)
Creatinine, Ser: 0.61 mg/dL (ref 0.44–1.00)
GFR calc Af Amer: >60 mL/min (ref >60)
GFR calc non Af Amer: >60 mL/min (ref >60)
Glucose, Bld: 85 mg/dL (ref 70–99)
Potassium: 3.2 mmol/L — ABNORMAL LOW (ref 3.5–5.1)
Sodium: 137 mmol/L (ref 135–145)

## 2019-04-29 LAB — CBC
HCT: 30.4 % — ABNORMAL LOW (ref 36.0–46.0)
Hemoglobin: 9.4 g/dL — ABNORMAL LOW (ref 12.0–15.0)
MCH: 27.2 pg (ref 26.0–34.0)
MCHC: 30.9 g/dL (ref 30.0–36.0)
MCV: 88.1 fL (ref 80.0–100.0)
Platelets: 356 10*3/uL (ref 150–400)
RBC: 3.45 MIL/uL — ABNORMAL LOW (ref 3.87–5.11)
RDW: 16.1 % — ABNORMAL HIGH (ref 11.5–15.5)
WBC: 9.5 10*3/uL (ref 4.0–10.5)
nRBC: 0 % (ref 0.0–0.2)

## 2019-04-29 MED ORDER — METRONIDAZOLE 500 MG PO TABS
500.0000 mg | ORAL_TABLET | Freq: Three times a day (TID) | ORAL | Status: DC
  Administered 2019-04-29: 500 mg via ORAL
  Filled 2019-04-29 (×3): qty 1

## 2019-04-29 MED ORDER — POTASSIUM CHLORIDE CRYS ER 20 MEQ PO TBCR
40.0000 meq | EXTENDED_RELEASE_TABLET | ORAL | Status: DC
  Administered 2019-04-29: 40 meq via ORAL
  Filled 2019-04-29: qty 2

## 2019-04-29 MED ORDER — AMOXICILLIN-POT CLAVULANATE 875-125 MG PO TABS: 1.0000 | ORAL_TABLET | Freq: Two times a day (BID) | ORAL | 0 refills | Status: AC

## 2019-04-29 MED ORDER — CIPROFLOXACIN HCL 500 MG PO TABS: 500.0000 mg | ORAL_TABLET | Freq: Two times a day (BID) | ORAL | Status: DC

## 2019-04-29 MED ORDER — SODIUM CHLORIDE 0.9% FLUSH: 10.0000 mL | INTRAVENOUS | Status: DC | PRN

## 2019-04-29 MED ORDER — OXYCODONE HCL 5 MG PO TABS: 5.0000 mg | ORAL_TABLET | ORAL | 0 refills | Status: AC | PRN

## 2019-04-29 MED ORDER — CIPROFLOXACIN HCL 500 MG PO TABS: 500.0000 mg | ORAL_TABLET | Freq: Two times a day (BID) | ORAL | 0 refills | Status: DC

## 2019-04-29 MED ORDER — METRONIDAZOLE 500 MG PO TABS: 500.0000 mg | ORAL_TABLET | Freq: Three times a day (TID) | ORAL | 0 refills | Status: DC

## 2019-04-29 NOTE — Progress Notes (Signed)
   04/29/19 0915  Clinical Encounter Type  Visited With Patient  Visit Type Follow-up  Referral From Chaplain  Consult/Referral To Chaplain  When Chaplain entered the room, patient was sitting on the side of her bed, saying how uncomfortable she was. Patient said said had been trying to get someone in her room to take care of two IV's that where giving her problems. Chaplain went to the nurses station and spoke to her nurse regarding patient's concerns. Nurse came to patient's room within less than 8 minutes. Patient felt better after her nurse took out IV in her left arm and tended to the one in her right arm. Because she was disconnecting the IV, her nurse said that she would give her hydrocodone for the pain. Patient said she preferred a pill over IV. Patient looked better after the nurse finished everything she was doing. Patient thanked Orthoptist. Chaplain told her that she would check back in on her before she leaves. Patient was even smiling when Chaplain left.

## 2019-04-29 NOTE — Discharge Summary (Signed)
Patient ID: Haley Delgado MRN: [id number] DOB/AGE: 04-Feb-1975 43 y.o.  Admit date: 04/26/2019 Discharge date: 04/29/2019   Discharge Diagnoses:  Active Problems:   Diverticulitis of intestine with abscess without bleeding   Procedures:percutaneous drain plecement by IR  Hospital Course:  44 yo female admitted for complicated diverticulitis with abscess, She was started on antibiotics and IR consulted for placement of drain x 2. She did very well with normalization of WBC culutres sens to zosyn and unasyn. I will prescribe augmentin after I  consulted w ID pharmacy. At  The time of discharge the patient was ambulating,  pain was controlled.  Her vital signs were stable and she was afebrile.   physical exam at discharge showed a pt  in no acute distress.  Awake and alert.  Abdomen: Soft , no tenderness or  peritonitis.  Extremities well-perfused and no edema.  Condition of the patient the time of discharge was stable. She will need repeat CT prior to drain removal given that the abscess recurred. Please note that I spent 35 minutes preparing and coordination her DC as well as counseling her.    Disposition: Discharge disposition: 01-Home or Self Care       Discharge Instructions    Call MD for:  difficulty breathing, headache or visual disturbances   Complete by: As directed    Call MD for:  extreme fatigue   Complete by: As directed    Call MD for:  hives   Complete by: As directed    Call MD for:  persistant dizziness or light-headedness   Complete by: As directed    Call MD for:  persistant nausea and vomiting   Complete by: As directed    Call MD for:  redness, tenderness, or signs of infection (pain, swelling, redness, odor or green/yellow discharge around incision site)   Complete by: As directed    Call MD for:  severe uncontrolled pain   Complete by: As directed    Call MD for:  temperature >100.4   Complete by: As directed    Diet - low sodium heart healthy    Complete by: As directed    Discharge instructions   Complete by: As directed    Please teach pt about JP care   Increase activity slowly   Complete by: As directed      Allergies as of 04/29/2019      Reactions   Peanut Oil Anaphylaxis, Other (See Comments)      Medication List    TAKE these medications   amoxicillin-clavulanate 875-125 MG tablet Commonly known as: Augmentin Take 1 tablet by mouth 2 (two) times daily for 14 days.   oxyCODONE 5 MG immediate release tablet Commonly known as: Oxy IR/ROXICODONE Take 1-2 tablets (5-10 mg total) by mouth every 4 (four) hours as needed for moderate pain.      Follow-up Information    Richarda Overlie, MD Follow up.   Specialties: Interventional Radiology, Radiology Why: IR scheduler will call you with appointment date/time - please call with any questions or concerns prior to your appointment.  Contact information: [address] [place] [address] [phone]        Kipton Skillen, [place] F, MD. Schedule an appointment as soon as possible for a visit in 1 week(s).   Specialty: General Surgery Why: follow up next week, hospital follow up for diverticulitis with abscess and drains x2 Contact information: [address] [place] [address] [phone]  Caroleen Hamman, MD FACS

## 2019-04-29 NOTE — Plan of Care (Signed)
Discharge order received. Patient mental status is at baseline. Vital signs stable . No signs of acute distress. Discharge instructions given regarding JP care and dressing change. Per MD no need to flush the JP drains. Patient verbalized understanding.Pt prescribed with augmentin and OXy upon discharge.No other issues noted at this time.

## 2019-04-29 NOTE — TOC Transition Note (Addendum)
Transition of Care [facility]) - CM/SW Discharge Note   Patient Details  Name: Haley Delgado MRN: [id number] Date of Birth: 1975-05-15  [facility] of [facility] CM/SW Contact:  Liliana Cline, LCSW Phone Number: 04/29/2019, 2:42 PM   Clinical Narrative:   Patient has orders to discharge today. New prescriptions faxed to [facility] and [facility]ication [facility] is aware. Informed patient who reported she will pick up (or have her Mother pick up) her medications before Medication Management closes at 4:30 today. Provided contact information for Medication Management. Patient denied having any other needs. She reported her Mother will drive her home when she is discharged. CSW signing off.   3:30- MD changed antibiotic. CSW informed and faxed updated antibiotic to Med Management.     Final next level of care: Home/Self Care Barriers to Discharge: Barriers Resolved   Patient Goals and CMS Choice        Discharge Placement                Patient to be transferred to facility by: Mother Name of family member notified: Patient and patient's mother Patient and family notified of of transfer: 04/29/19  Discharge Plan and Services                                     Social Determinants of Health (SDOH) Interventions     Readmission Risk Interventions No flowsheet data found.

## 2019-05-02 ENCOUNTER — Other Ambulatory Visit: Payer: Self-pay | Admitting: Surgery

## 2019-05-02 DIAGNOSIS — K5720 Diverticulitis of large intestine with perforation and abscess without bleeding: Secondary | ICD-10-CM

## 2019-05-02 LAB — AEROBIC/ANAEROBIC CULTURE W GRAM STAIN (SURGICAL/DEEP WOUND)

## 2019-05-04 ENCOUNTER — Ambulatory Visit
Admission: RE | Admit: 2019-05-04 | Discharge: 2019-05-04 | Disposition: A | Discharge status: Home / Self Care | Payer: Medicaid Other | Source: Ambulatory Visit | Attending: Physician Assistant | Admitting: Physician Assistant

## 2019-05-04 ENCOUNTER — Encounter: Payer: Self-pay | Admitting: Radiology

## 2019-05-04 ENCOUNTER — Ambulatory Visit
Admission: RE | Admit: 2019-05-04 | Discharge: 2019-05-04 | Disposition: A | Discharge status: Home / Self Care | Payer: Medicaid Other | Source: Ambulatory Visit | Attending: Surgery | Admitting: Surgery

## 2019-05-04 DIAGNOSIS — K5720 Diverticulitis of large intestine with perforation and abscess without bleeding: Secondary | ICD-10-CM

## 2019-05-04 HISTORY — PX: IR RADIOLOGIST EVAL & MGMT: IMG5224

## 2019-05-04 MED ORDER — IOPAMIDOL (ISOVUE-300) INJECTION 61%
125.0000 mL | Freq: Once | INTRAVENOUS | Status: AC | PRN
  Administered 2019-05-04: 125 mL via INTRAVENOUS

## 2019-05-04 NOTE — Progress Notes (Signed)
Referring Physician(s): Dr. Everlene Farrier  Chief Complaint: The patient is seen in follow up today s/p diverticular abscess  History of present illness: Haley Delgado is a 44 year old female with history of diverticular abscess who underwent drain placement in early March 2021.  She demonstrated improvement and subsequent imaging showed resolve of her abscess and as such her previous drain was removed.  Unfortunately, she then presented to [facility] ED 3/30 with abdominal pain and leukocytosis.  CT Abdomen Pelvis showed recurrent of her abdominal abscess.  She underwent drain placement x2 04/27/19.  Cultures were positive for E coli. She was ultimately discharged home with Augmentin.  She returns to drain clinic today for routine follow-up.   Haley Delgado reports her drains have been functioning relatively well. She reports decreased output from the anterior drain over the past few days.  It is slight tender.  Her transgluteal drain has continued to put out significant output daily, between 30-50 mL. Her daughter has been assisting with drain care, mostly draining bulb and dressing changes.  She has not been flushing. She continues Augmentin.   No past medical history on file.  Past Surgical History:  Procedure Laterality Date  . TUBAL LIGATION      Allergies: Peanut oil  Medications: Prior to Admission medications   Medication Sig Start Date End Date Taking? Authorizing Provider  amoxicillin-clavulanate (AUGMENTIN) 875-125 MG tablet Take 1 tablet by mouth 2 (two) times daily for 14 days. 04/29/19 05/13/19  Pabon, Diego F, MD  oxyCODONE (OXY IR/ROXICODONE) 5 MG immediate release tablet Take 1-2 tablets (5-10 mg total) by mouth every 4 (four) hours as needed for moderate pain. 04/29/19   Pabon, Merri Ray, MD     No family history on file.  Social History   Socioeconomic History  . Marital status: Single    Spouse name: Not on file  . Number of children: Not on file  . Years of education: Not on file   . Highest education level: Not on file  Occupational History  . Not on file  Tobacco Use  . Smoking status: Current Every Day Smoker  . Smokeless tobacco: Never Used  Substance and Sexual Activity  . Alcohol use: Not on file  . Drug use: Not on file  . Sexual activity: Not on file  Other Topics Concern  . Not on file  Social History Narrative  . Not on file   Social Determinants of Health   Financial Resource Strain:   . Difficulty of Paying Living Expenses:   Food Insecurity:   . Worried About Programme researcher, broadcasting/film/video in the Last Year:   . Barista in the Last Year:   Transportation Needs:   . Freight forwarder (Medical):   Marland Kitchen Lack of Transportation (Non-Medical):   Physical Activity:   . Days of Exercise per Week:   . Minutes of Exercise per Session:   Stress:   . Feeling of Stress :   Social Connections:   . Frequency of Communication with Friends and Family:   . Frequency of Social Gatherings with Friends and Family:   . Attends Religious Services:   . Active Member of Clubs or Organizations:   . Attends Banker Meetings:   Marland Kitchen Marital Status:      Vital Signs: LMP 04/12/2019 (Exact Date) Comment: bilateral tubal removal  Physical Exam  NAD, alert Abdomen: soft.  Anterior lower abdominal drain in place.  Suture intact with bruising noted.  Small  amount of old, serosanguinous fluid in bulb.  TG drain in place. Insertion site c/d/i. Thick, purulent, pink fluid in bulb.   Imaging: No results found.  Labs:  CBC: Recent Labs    04/26/19 0732 04/27/19 0537 04/28/19 0510 04/29/19 0522  WBC 15.9* 12.8* 10.7* 9.5  HGB 11.7* 10.7* 9.4* 9.4*  HCT 36.2 33.8* 29.7* 30.4*  PLT 477* 340 305 356    COAGS: Recent Labs    04/27/19 0537  INR 1.4*  APTT 42*    BMP: Recent Labs    04/26/19 0732 04/27/19 0537 04/28/19 0510 04/29/19 0522  NA 131* 136 135 137  K 3.1* 3.1* 3.2* 3.2*  CL 97* 103 104 103  CO2 20* 22 25 26   GLUCOSE 188*  131* 94 85  BUN <5* 9 8 6   CALCIUM 8.9 8.1* 7.8* 7.7*  CREATININE 0.93 0.86 0.62 0.61  GFRNONAA >60 >60 >60 >60  GFRAA >60 >60 >60 >60    LIVER FUNCTION TESTS: Recent Labs    04/26/19 0732  BILITOT 1.8*  AST 24  ALT 21  ALKPHOS 66  PROT 8.0  ALBUMIN 3.4*    Assessment: Perforated diverticulitis requiring percutaneous drain placement 04/04/19 at [facility] with removal of drain 04/20/19 who presented to [facility] ED 3/30 with recurrent, now s/p anterior and TG drain replacement 3/31 by Dr. Anselm Pancoast Haley Delgado presents to IR drain clinic today for follow-up of her intra-abdominal drains.  Her anterior drain has had minimal output per her report.  The TG drain has significant output of 30-50 mL/day.  Output is thick, pink in appearance.  She denies fevers but complains of mild abdominal pain near her anterior drain site. She has continued Augmentin at home.  CT imaging and drain injection performed today.  CT Abdomen Pelvis w/ contrast reviewed by Dr. Laurence Ferrari who notes the anterior drain has retracted with interval decrease in size of fluid collection.  Her TG drain remains in position without demonstration of fistula on drain injection today.  Given persistent multifocal pelvic abscesses with ongoing significant drainage, TG drain to remain in place today.   Anterior drain removed in it's entirety without complication. Dressing applied.  Patient provided with wound and drain care instructions. She should flush the TG drain once daily with sterile saline. A video with teaching instructions was recorded for her daughter. A prescription for flushes was provided to the patient.  She verbalizes understanding of all instructions given.  She is also encouraged to complete her antibiotics course and to call her surgeon's office.  She does not have scheduled follow-up at this time.  She should return to IR drain clinic for repeat CT imaging and drain injection in 2 weeks time.   Signed: Docia Barrier, PA 05/04/2019, 1:55 PM   Please refer to Dr. Katrinka Blazing attestation of this note for management and plan.

## 2019-05-05 ENCOUNTER — Other Ambulatory Visit: Payer: Self-pay

## 2019-05-05 ENCOUNTER — Other Ambulatory Visit: Payer: Self-pay | Admitting: Surgery

## 2019-05-05 ENCOUNTER — Emergency Department
Admission: EM | Admit: 2019-05-05 | Discharge: 2019-05-05 | Disposition: A | Discharge status: Home / Self Care | Payer: Medicaid Other | Attending: Emergency Medicine | Admitting: Emergency Medicine

## 2019-05-05 DIAGNOSIS — Z9889 Other specified postprocedural states: Secondary | ICD-10-CM | POA: Insufficient documentation

## 2019-05-05 DIAGNOSIS — Z5321 Procedure and treatment not carried out due to patient leaving prior to being seen by health care provider: Secondary | ICD-10-CM | POA: Insufficient documentation

## 2019-05-05 DIAGNOSIS — R1110 Vomiting, unspecified: Secondary | ICD-10-CM | POA: Insufficient documentation

## 2019-05-05 DIAGNOSIS — K651 Peritoneal abscess: Secondary | ICD-10-CM

## 2019-05-05 LAB — LACTIC ACID, PLASMA: Lactic Acid, Venous: 1.2 mmol/L (ref 0.5–1.9)

## 2019-05-05 LAB — COMPREHENSIVE METABOLIC PANEL
ALT: 8 U/L (ref 0–44)
AST: 13 U/L — ABNORMAL LOW (ref 15–41)
Albumin: 2.8 g/dL — ABNORMAL LOW (ref 3.5–5.0)
Alkaline Phosphatase: 62 U/L (ref 38–126)
Anion gap: 10 (ref 5–15)
BUN: 7 mg/dL (ref 6–20)
CO2: 25 mmol/L (ref 22–32)
Calcium: 8.7 mg/dL — ABNORMAL LOW (ref 8.9–10.3)
Chloride: 99 mmol/L (ref 98–111)
Creatinine, Ser: 0.75 mg/dL (ref 0.44–1.00)
GFR calc Af Amer: >60 mL/min (ref >60)
GFR calc non Af Amer: >60 mL/min (ref >60)
Glucose, Bld: 111 mg/dL — ABNORMAL HIGH (ref 70–99)
Potassium: 3.3 mmol/L — ABNORMAL LOW (ref 3.5–5.1)
Sodium: 134 mmol/L — ABNORMAL LOW (ref 135–145)
Total Bilirubin: 0.7 mg/dL (ref 0.3–1.2)
Total Protein: 7.6 g/dL (ref 6.5–8.1)

## 2019-05-05 LAB — CBC
HCT: 33 % — ABNORMAL LOW (ref 36.0–46.0)
Hemoglobin: 11 g/dL — ABNORMAL LOW (ref 12.0–15.0)
MCH: 27.3 pg (ref 26.0–34.0)
MCHC: 33.3 g/dL (ref 30.0–36.0)
MCV: 81.9 fL (ref 80.0–100.0)
Platelets: 623 10*3/uL — ABNORMAL HIGH (ref 150–400)
RBC: 4.03 MIL/uL (ref 3.87–5.11)
RDW: 16.4 % — ABNORMAL HIGH (ref 11.5–15.5)
WBC: 21.2 10*3/uL — ABNORMAL HIGH (ref 4.0–10.5)
nRBC: 0 % (ref 0.0–0.2)

## 2019-05-05 LAB — LIPASE, BLOOD: Lipase: 20 U/L (ref 11–51)

## 2019-05-05 MED ORDER — ACETAMINOPHEN 325 MG PO TABS: 650.0000 mg | ORAL_TABLET | Freq: Once | ORAL | Status: AC

## 2019-05-05 MED ORDER — ACETAMINOPHEN 325 MG PO TABS
ORAL_TABLET | ORAL | Status: AC
  Administered 2019-05-05: 650 mg via ORAL
  Filled 2019-05-05: qty 1

## 2019-05-05 NOTE — ED Triage Notes (Signed)
Pt states she had surgery last Wednesday states has diverticulitis and had 2 drains place. States one drain was removed yesterday, today has been vomiting, hot, unsure of fever.

## 2019-05-06 ENCOUNTER — Telehealth: Payer: Self-pay | Admitting: *Deleted

## 2019-05-06 NOTE — Telephone Encounter (Signed)
Patient called and stated that she is a patient of Dr Herma Mering and was seen in the ER on 04/26/19 for diverticulitis and she is vomiting, and in a lot of pain she is also running a fever off and on, around 100 to 102. She stated she just feels sick, She did try to go to the emergency room yesterday but it was a 8 hour wait and she didn't feel like waiting. Please call and advise

## 2019-05-06 NOTE — Telephone Encounter (Signed)
Spoke with the patient and she has been taking her antibiotics that were prescribed. She says that the fever has been spiking and she has been throwing up and has abdominal pain. Patient advised that she needs to return to the ER as she may need to have another drain placed if she has an additional abscess or she may need IV antibiotics. Patient is amendable to this.

## 2019-05-09 MED ORDER — LOVENOX 150 MG/ML ~~LOC~~ SOLN
0.50 | SUBCUTANEOUS | Status: DC
Start: ? — End: 2019-05-09

## 2019-05-09 MED ORDER — Medication
Status: DC
Start: ? — End: 2019-05-09

## 2019-05-09 MED ORDER — GENERIC EXTERNAL MEDICATION
Status: DC
Start: ? — End: 2019-05-09

## 2019-05-09 MED ORDER — LACTATED RINGERS IV SOLN
100.00 | INTRAVENOUS | Status: DC
Start: ? — End: 2019-05-09

## 2019-05-09 MED ORDER — PANTOPRAZOLE SODIUM 40 MG IV SOLR
40.00 | INTRAVENOUS | Status: DC
Start: 2019-05-14 — End: 2019-05-09

## 2019-05-09 MED ORDER — ACETAMINOPHEN 500 MG PO TABS
1000.00 | ORAL_TABLET | ORAL | Status: DC
Start: 2019-05-13 — End: 2019-05-09

## 2019-05-09 MED ORDER — VANCOMYCIN HCL 25 MG/ML PO SOLR
125.00 | ORAL | Status: DC
Start: 2019-05-13 — End: 2019-05-09

## 2019-05-09 MED ORDER — PIPERACILLIN SOD-TAZOBACTAM SO 3.375 (3-0.375) G IV SOLR
3.38 | INTRAVENOUS | Status: DC
Start: 2019-05-13 — End: 2019-05-09

## 2019-05-09 MED ORDER — Medication
12.50 | Status: DC
Start: ? — End: 2019-05-09

## 2019-05-09 MED ORDER — ACCU-PRO PUMP SET/VENT MISC
2.50 | Status: DC
Start: ? — End: 2019-05-09

## 2019-05-09 MED ORDER — LIDOCAINE 5 % EX PTCH
2.00 | MEDICATED_PATCH | CUTANEOUS | Status: DC
Start: 2019-05-14 — End: 2019-05-09

## 2019-05-09 MED ORDER — DIPHENHYDRAMINE HCL 25 MG PO CAPS
12.50 | ORAL_CAPSULE | ORAL | Status: DC
Start: ? — End: 2019-05-09

## 2019-05-10 LAB — CULTURE, BLOOD (ROUTINE X 2)
Culture: NO GROWTH
Special Requests: ADEQUATE

## 2019-05-11 ENCOUNTER — Inpatient Hospital Stay: Payer: Self-pay | Admitting: Surgery

## 2019-05-13 MED ORDER — Medication
Status: DC
Start: ? — End: 2019-05-13

## 2019-05-13 MED ORDER — GENERIC EXTERNAL MEDICATION
Status: DC
Start: ? — End: 2019-05-13

## 2019-05-17 ENCOUNTER — Other Ambulatory Visit: Payer: Medicaid Other

## 2019-05-17 ENCOUNTER — Inpatient Hospital Stay: Admission: RE | Admit: 2019-05-17 | Payer: Medicaid Other | Source: Ambulatory Visit

## 2020-11-05 ENCOUNTER — Ambulatory Visit: Payer: BC Managed Care – PPO

## 2020-12-17 IMAGING — RF DG SINUS / FISTULA TRACT / ABSCESSOGRAM
5 series · 13 of 13 positions shown · non-contrast
Comparison: none

INDICATION: 43-year-old female with a history of diverticular abscess status
post placement of percutaneous drainage catheter on 04/27/2019. The
drainage catheter continues to produce relatively high output of
purulent fluid, up to 50 mL daily. She presents for drain injection
to evaluate for possible fistula.

[Series 1: one shot · 1 of 1 slices shown (1 of 3)]
[im 1/1]
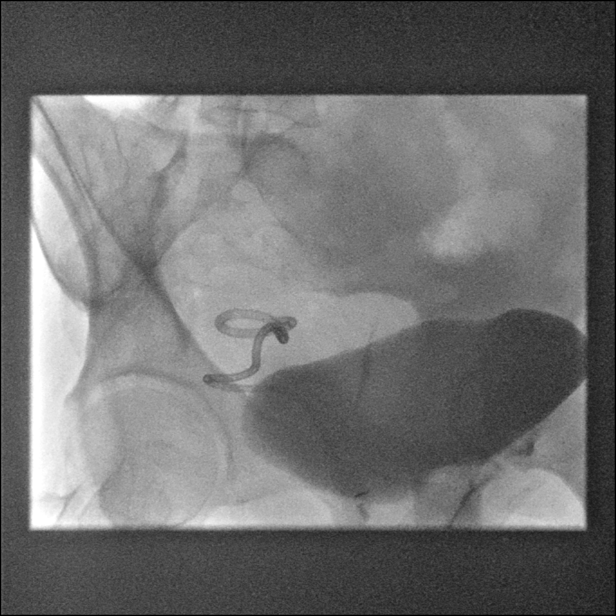

[Series 2: sequence · 4 of 82 frames shown (1 of 2)]
[frame 13/82]
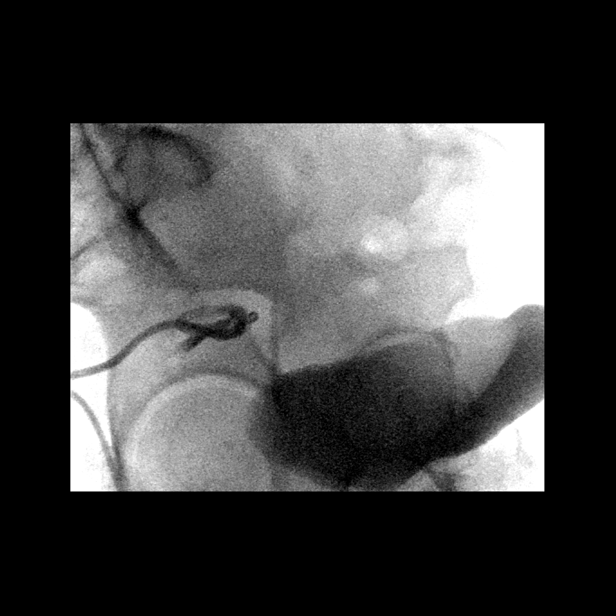
[frame 33/82]
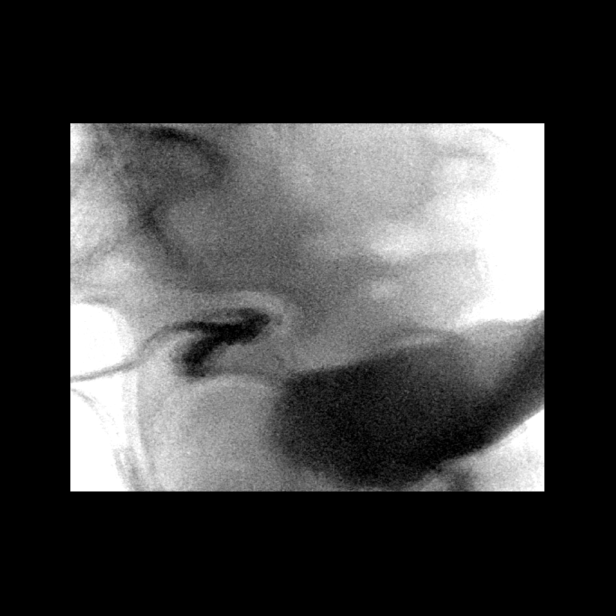
[frame 42/82]
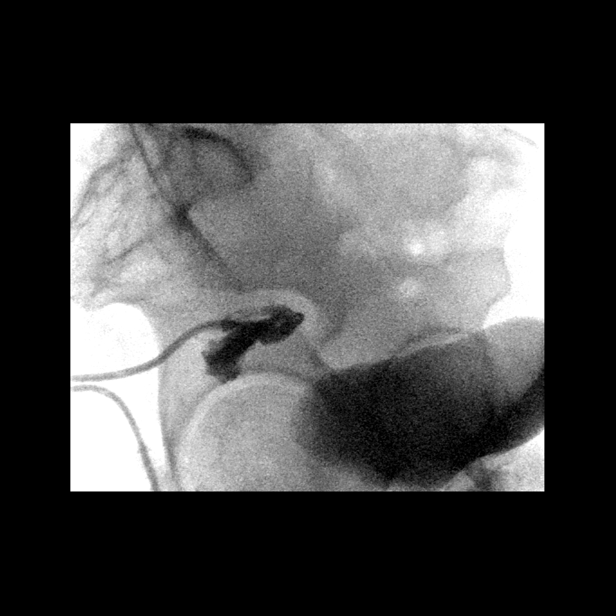
[frame 70/82]
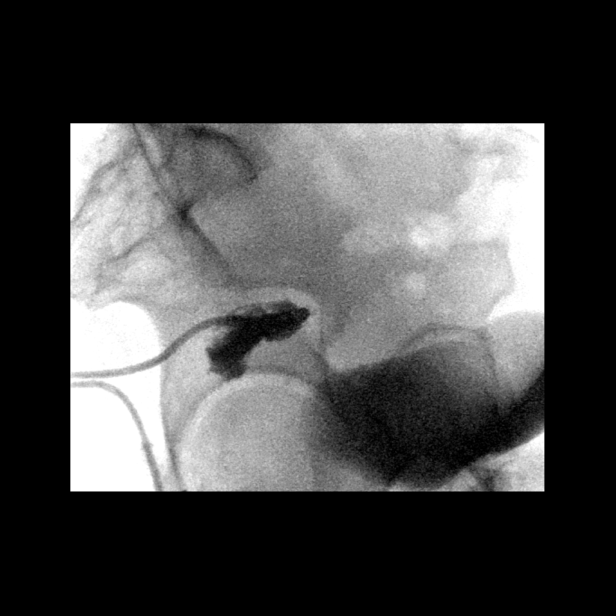

[Series 3: one shot · 0.15mm/px · 2 of 2 slices shown (2 of 3)]
[im 1/2]
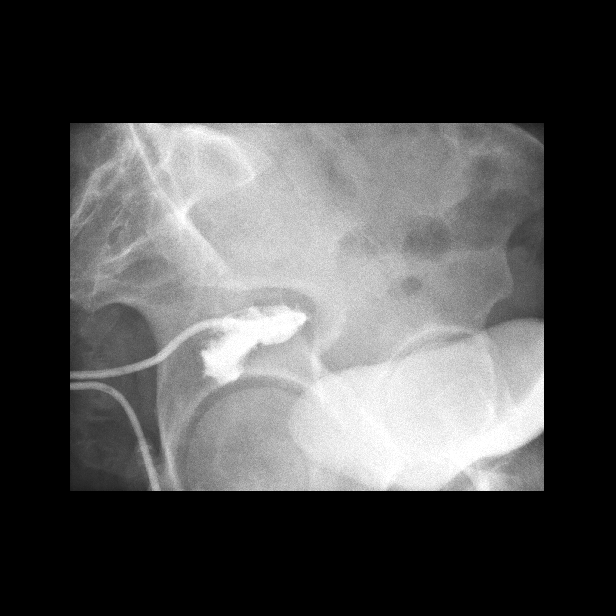
[im 2/2]
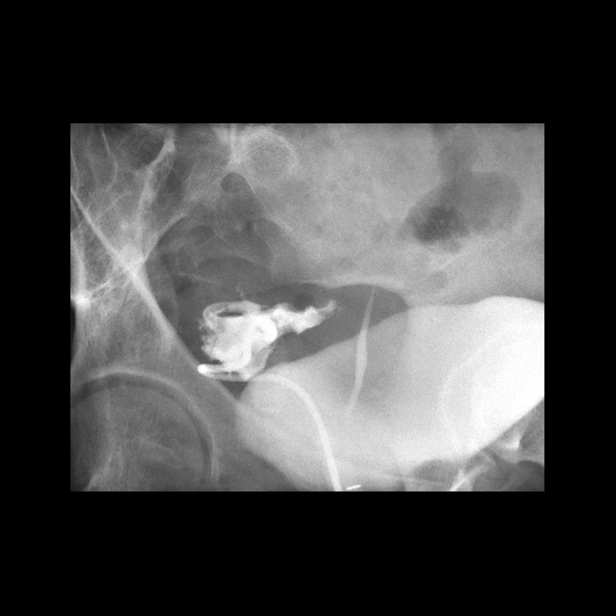

[Series 4: sequence · 4 of 20 frames shown (2 of 2)]
[frame 4/20]
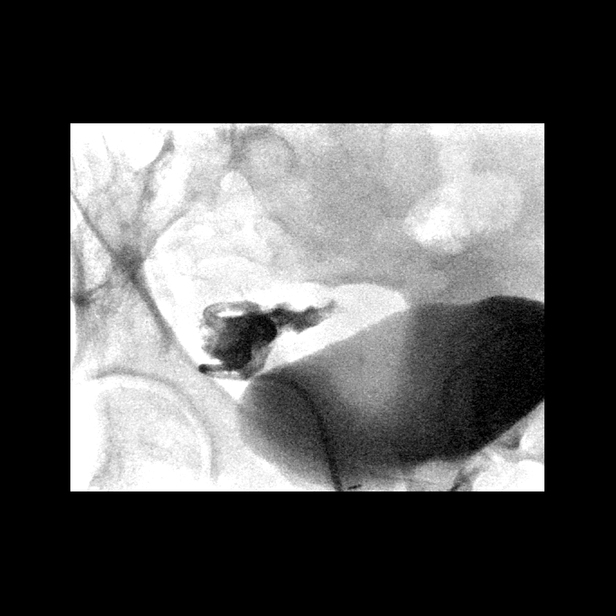
[frame 8/20]
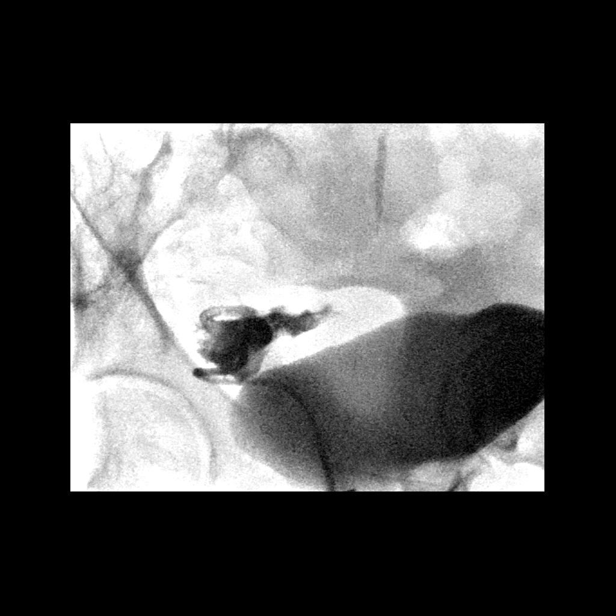
[frame 11/20]
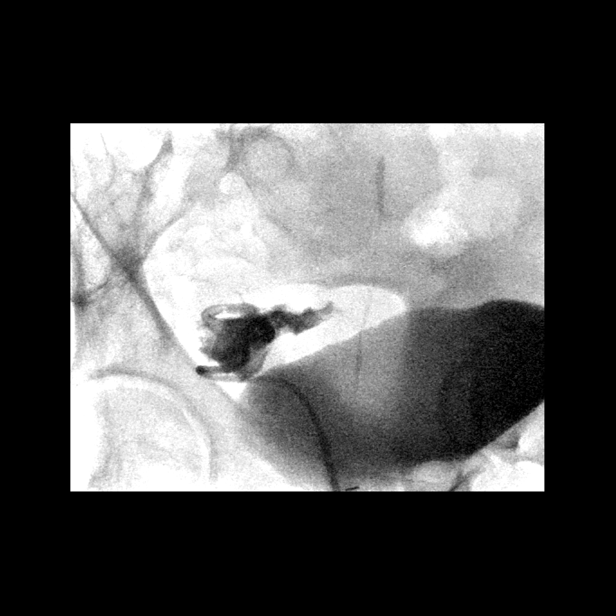
[frame 18/20]
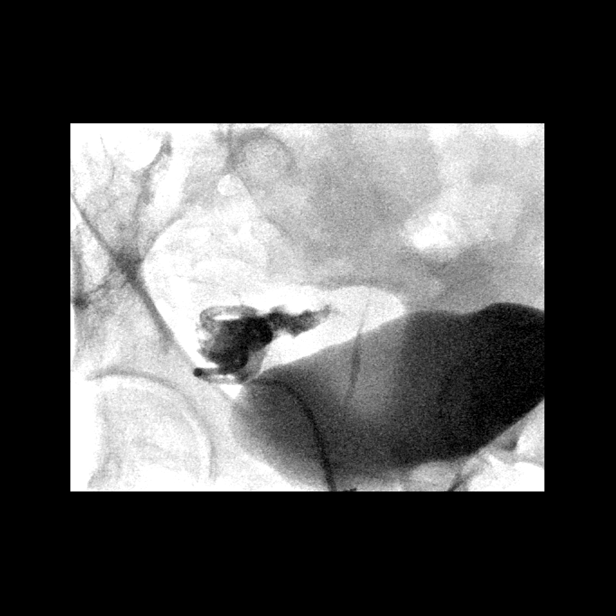

[Series 5: one shot · 0.15mm/px · 2 of 2 slices shown (3 of 3)]
[im 1/2]
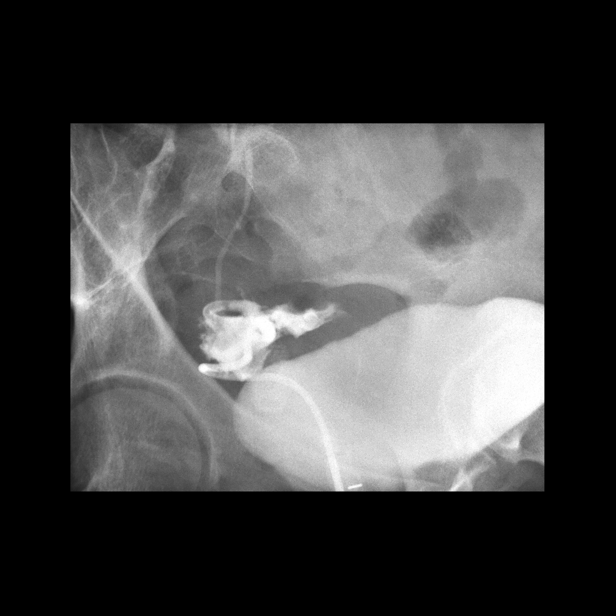
[im 2/2]
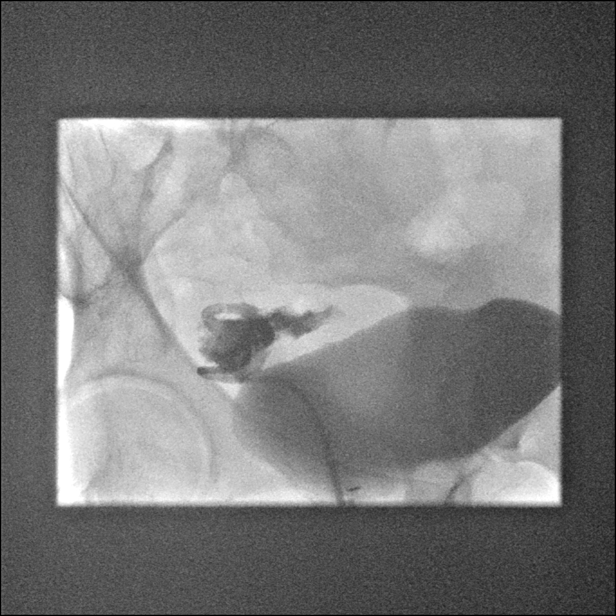

[13 of 13 positions shown; findings below may reference images not displayed]

EXAM:
Drain injection

MEDICATIONS:
None.

ANESTHESIA/SEDATION:
None.

COMPLICATIONS:
None immediate.

PROCEDURE:
Initial fluoroscopic imaging demonstrates a right transgluteal
approach drainage catheter overlying the right hemipelvis. Contrast
injection opacifies a partially collapsed abscess cavity. No
definitive fistulous communication identified. Excreted contrast
material partially opacifies the bladder and distal ureters.
IMPRESSION: Partially collapsed abscess cavity without definitive fistulous
communication at this time.

## 2022-01-09 DIAGNOSIS — R600 Localized edema: Secondary | ICD-10-CM | POA: Diagnosis not present

## 2022-02-17 DIAGNOSIS — K219 Gastro-esophageal reflux disease without esophagitis: Secondary | ICD-10-CM | POA: Diagnosis not present

## 2022-02-17 DIAGNOSIS — K5900 Constipation, unspecified: Secondary | ICD-10-CM | POA: Diagnosis not present

## 2022-02-17 DIAGNOSIS — N83202 Unspecified ovarian cyst, left side: Secondary | ICD-10-CM | POA: Diagnosis not present

## 2022-02-17 DIAGNOSIS — F17210 Nicotine dependence, cigarettes, uncomplicated: Secondary | ICD-10-CM | POA: Diagnosis not present

## 2022-02-17 DIAGNOSIS — Z79899 Other long term (current) drug therapy: Secondary | ICD-10-CM | POA: Diagnosis not present

## 2022-02-24 DIAGNOSIS — Z6837 Body mass index (BMI) 37.0-37.9, adult: Secondary | ICD-10-CM | POA: Diagnosis not present

## 2022-02-24 DIAGNOSIS — D508 Other iron deficiency anemias: Secondary | ICD-10-CM | POA: Diagnosis not present

## 2022-02-24 DIAGNOSIS — M5136 Other intervertebral disc degeneration, lumbar region: Secondary | ICD-10-CM | POA: Diagnosis not present

## 2022-02-24 DIAGNOSIS — N921 Excessive and frequent menstruation with irregular cycle: Secondary | ICD-10-CM | POA: Diagnosis not present

## 2022-02-24 DIAGNOSIS — D259 Leiomyoma of uterus, unspecified: Secondary | ICD-10-CM | POA: Diagnosis not present

## 2022-02-24 DIAGNOSIS — R7303 Prediabetes: Secondary | ICD-10-CM | POA: Diagnosis not present

## 2022-02-24 DIAGNOSIS — E669 Obesity, unspecified: Secondary | ICD-10-CM | POA: Diagnosis not present

## 2022-02-24 DIAGNOSIS — I10 Essential (primary) hypertension: Secondary | ICD-10-CM | POA: Diagnosis not present

## 2022-02-24 DIAGNOSIS — I509 Heart failure, unspecified: Secondary | ICD-10-CM | POA: Diagnosis not present

## 2022-03-27 DIAGNOSIS — N921 Excessive and frequent menstruation with irregular cycle: Secondary | ICD-10-CM | POA: Diagnosis not present

## 2022-03-27 DIAGNOSIS — D259 Leiomyoma of uterus, unspecified: Secondary | ICD-10-CM | POA: Diagnosis not present

## 2022-03-27 DIAGNOSIS — N83202 Unspecified ovarian cyst, left side: Secondary | ICD-10-CM | POA: Diagnosis not present

## 2022-03-27 DIAGNOSIS — D251 Intramural leiomyoma of uterus: Secondary | ICD-10-CM | POA: Diagnosis not present

## 2022-04-01 ENCOUNTER — Ambulatory Visit: Payer: BC Managed Care – PPO | Attending: Family Medicine | Admitting: Physical Therapy

## 2022-04-03 ENCOUNTER — Encounter: Payer: Medicaid Other | Admitting: Physical Therapy

## 2022-04-08 ENCOUNTER — Ambulatory Visit: Payer: BC Managed Care – PPO | Attending: Family Medicine | Admitting: Physical Therapy

## 2022-04-10 ENCOUNTER — Encounter: Payer: Medicaid Other | Admitting: Physical Therapy

## 2022-04-17 ENCOUNTER — Encounter: Payer: Medicaid Other | Admitting: Physical Therapy

## 2022-04-24 ENCOUNTER — Encounter: Payer: Medicaid Other | Admitting: Physical Therapy

## 2022-05-01 ENCOUNTER — Encounter: Payer: Medicaid Other | Admitting: Physical Therapy

## 2022-05-08 ENCOUNTER — Encounter: Payer: Medicaid Other | Admitting: Physical Therapy

## 2022-05-15 ENCOUNTER — Encounter: Payer: Medicaid Other | Admitting: Physical Therapy

## 2022-05-20 ENCOUNTER — Encounter: Payer: Medicaid Other | Admitting: Physical Therapy

## 2022-05-22 ENCOUNTER — Encounter: Payer: Medicaid Other | Admitting: Physical Therapy

## 2022-06-16 DIAGNOSIS — Z91010 Allergy to peanuts: Secondary | ICD-10-CM | POA: Diagnosis not present

## 2022-06-16 DIAGNOSIS — K219 Gastro-esophageal reflux disease without esophagitis: Secondary | ICD-10-CM | POA: Diagnosis not present

## 2022-06-16 DIAGNOSIS — R102 Pelvic and perineal pain: Secondary | ICD-10-CM | POA: Diagnosis not present

## 2022-06-16 DIAGNOSIS — F17210 Nicotine dependence, cigarettes, uncomplicated: Secondary | ICD-10-CM | POA: Diagnosis not present

## 2022-06-16 DIAGNOSIS — N921 Excessive and frequent menstruation with irregular cycle: Secondary | ICD-10-CM | POA: Diagnosis not present

## 2022-06-16 DIAGNOSIS — I10 Essential (primary) hypertension: Secondary | ICD-10-CM | POA: Diagnosis not present

## 2022-06-16 DIAGNOSIS — Z7984 Long term (current) use of oral hypoglycemic drugs: Secondary | ICD-10-CM | POA: Diagnosis not present

## 2022-06-16 DIAGNOSIS — N939 Abnormal uterine and vaginal bleeding, unspecified: Secondary | ICD-10-CM | POA: Diagnosis not present

## 2022-06-16 DIAGNOSIS — D259 Leiomyoma of uterus, unspecified: Secondary | ICD-10-CM | POA: Diagnosis not present

## 2022-06-16 DIAGNOSIS — G8929 Other chronic pain: Secondary | ICD-10-CM | POA: Diagnosis not present

## 2022-06-16 DIAGNOSIS — M6289 Other specified disorders of muscle: Secondary | ICD-10-CM | POA: Diagnosis not present

## 2023-08-05 DIAGNOSIS — I10 Essential (primary) hypertension: Secondary | ICD-10-CM | POA: Diagnosis not present

## 2023-08-05 DIAGNOSIS — Z79899 Other long term (current) drug therapy: Secondary | ICD-10-CM | POA: Diagnosis not present

## 2023-08-05 DIAGNOSIS — R9431 Abnormal electrocardiogram [ECG] [EKG]: Secondary | ICD-10-CM | POA: Diagnosis not present

## 2023-08-05 DIAGNOSIS — M5412 Radiculopathy, cervical region: Secondary | ICD-10-CM | POA: Diagnosis not present

## 2023-08-05 DIAGNOSIS — M5413 Radiculopathy, cervicothoracic region: Secondary | ICD-10-CM | POA: Diagnosis not present

## 2023-08-18 DIAGNOSIS — E118 Type 2 diabetes mellitus with unspecified complications: Secondary | ICD-10-CM | POA: Diagnosis not present

## 2023-08-18 DIAGNOSIS — I10 Essential (primary) hypertension: Secondary | ICD-10-CM | POA: Diagnosis not present

## 2023-08-18 DIAGNOSIS — G8929 Other chronic pain: Secondary | ICD-10-CM | POA: Diagnosis not present

## 2023-08-18 DIAGNOSIS — F17210 Nicotine dependence, cigarettes, uncomplicated: Secondary | ICD-10-CM | POA: Diagnosis not present

## 2023-08-18 DIAGNOSIS — F1090 Alcohol use, unspecified, uncomplicated: Secondary | ICD-10-CM | POA: Diagnosis not present

## 2023-08-18 DIAGNOSIS — Z6837 Body mass index (BMI) 37.0-37.9, adult: Secondary | ICD-10-CM | POA: Diagnosis not present

## 2023-08-18 DIAGNOSIS — E669 Obesity, unspecified: Secondary | ICD-10-CM | POA: Diagnosis not present

## 2023-08-18 DIAGNOSIS — M7918 Myalgia, other site: Secondary | ICD-10-CM | POA: Diagnosis not present

## 2023-10-07 DIAGNOSIS — F1090 Alcohol use, unspecified, uncomplicated: Secondary | ICD-10-CM | POA: Diagnosis not present

## 2023-10-07 DIAGNOSIS — E118 Type 2 diabetes mellitus with unspecified complications: Secondary | ICD-10-CM | POA: Diagnosis not present

## 2023-10-07 DIAGNOSIS — G8929 Other chronic pain: Secondary | ICD-10-CM | POA: Diagnosis not present

## 2023-10-07 DIAGNOSIS — I10 Essential (primary) hypertension: Secondary | ICD-10-CM | POA: Diagnosis not present

## 2023-10-07 DIAGNOSIS — E781 Pure hyperglyceridemia: Secondary | ICD-10-CM | POA: Diagnosis not present

## 2023-11-24 DIAGNOSIS — M791 Myalgia, unspecified site: Secondary | ICD-10-CM | POA: Diagnosis not present

## 2023-11-24 DIAGNOSIS — E781 Pure hyperglyceridemia: Secondary | ICD-10-CM | POA: Diagnosis not present

## 2023-11-24 DIAGNOSIS — G8929 Other chronic pain: Secondary | ICD-10-CM | POA: Diagnosis not present

## 2023-11-24 DIAGNOSIS — I10 Essential (primary) hypertension: Secondary | ICD-10-CM | POA: Diagnosis not present

## 2023-11-24 DIAGNOSIS — Z1231 Encounter for screening mammogram for malignant neoplasm of breast: Secondary | ICD-10-CM | POA: Diagnosis not present

## 2023-11-24 DIAGNOSIS — D509 Iron deficiency anemia, unspecified: Secondary | ICD-10-CM | POA: Diagnosis not present

## 2023-11-24 DIAGNOSIS — F1090 Alcohol use, unspecified, uncomplicated: Secondary | ICD-10-CM | POA: Diagnosis not present

## 2023-11-24 DIAGNOSIS — E118 Type 2 diabetes mellitus with unspecified complications: Secondary | ICD-10-CM | POA: Diagnosis not present
